# Patient Record
Sex: Male | Born: 1980 | Race: White | Hispanic: No | Marital: Married | Smoking: Never smoker
Health system: Southern US, Community
[De-identification: ages and names within clinical notes are randomized; demographics above are authoritative.]

## PROBLEM LIST (undated history)

## (undated) DIAGNOSIS — H9190 Unspecified hearing loss, unspecified ear: Secondary | ICD-10-CM

## (undated) DIAGNOSIS — M549 Dorsalgia, unspecified: Secondary | ICD-10-CM

## (undated) DIAGNOSIS — I10 Essential (primary) hypertension: Secondary | ICD-10-CM

## (undated) DIAGNOSIS — G8929 Other chronic pain: Secondary | ICD-10-CM

## (undated) HISTORY — DX: Essential (primary) hypertension: I10

## (undated) HISTORY — DX: Other chronic pain: G89.29

## (undated) HISTORY — DX: Unspecified hearing loss, unspecified ear: H91.90

## (undated) HISTORY — DX: Dorsalgia, unspecified: M54.9

---

## 2013-08-07 LAB — BASIC METABOLIC PANEL
BUN: 13 mg/dL (ref 4–21)
CREATININE: 0.8 mg/dL (ref <=1.3)
Glucose: 88 mg/dL
POTASSIUM: 3.9 mmol/L (ref 3.4–5.3)
Sodium: 142 mmol/L (ref 137–147)

## 2013-08-07 LAB — HEPATIC FUNCTION PANEL
ALT: 23 U/L (ref 10–40)
AST: 19 U/L (ref 14–40)
Alkaline Phosphatase: 119 U/L (ref 25–125)
Bilirubin, Total: 0.6 mg/dL

## 2013-08-07 LAB — CBC AND DIFFERENTIAL
HCT: 45 % (ref 41–53)
Hemoglobin: 15.3 g/dL (ref 13.5–17.5)
Platelets: 305 10*3/uL (ref 150–399)
WBC: 7.5 10*3/mL

## 2013-08-07 LAB — LIPID PANEL
CHOLESTEROL: 240 mg/dL — AB (ref 0–200)
HDL: 48 mg/dL (ref 35–70)
LDL Cholesterol: 157 mg/dL
Triglycerides: 173 mg/dL — AB (ref 40–160)

## 2013-08-07 LAB — TSH: TSH: 1.51 u[IU]/mL (ref <=5.90)

## 2016-02-24 DIAGNOSIS — H9190 Unspecified hearing loss, unspecified ear: Secondary | ICD-10-CM | POA: Insufficient documentation

## 2016-02-24 DIAGNOSIS — M79672 Pain in left foot: Secondary | ICD-10-CM | POA: Insufficient documentation

## 2016-02-24 DIAGNOSIS — M549 Dorsalgia, unspecified: Secondary | ICD-10-CM | POA: Insufficient documentation

## 2016-08-30 ENCOUNTER — Telehealth: Payer: Self-pay | Admitting: Family Medicine

## 2016-08-30 NOTE — Telephone Encounter (Signed)
Patient called stating that he received a call from [facility] location. I was unable to find who called patient and he was not sure either. I assured patient that we still have his appointment scheduled and look forward to seeing him then.

## 2016-09-10 ENCOUNTER — Encounter: Payer: Self-pay | Admitting: Family Medicine

## 2016-09-10 ENCOUNTER — Ambulatory Visit (INDEPENDENT_AMBULATORY_CARE_PROVIDER_SITE_OTHER): Payer: BLUE CROSS/BLUE SHIELD | Admitting: Family Medicine

## 2016-09-10 VITALS — BP 154/106 | HR 86 | Temp 98.3°F | Ht 65.5 in | Wt 185.2 lb

## 2016-09-10 DIAGNOSIS — R030 Elevated blood-pressure reading, without diagnosis of hypertension: Secondary | ICD-10-CM | POA: Diagnosis not present

## 2016-09-10 DIAGNOSIS — M546 Pain in thoracic spine: Secondary | ICD-10-CM

## 2016-09-10 DIAGNOSIS — E669 Obesity, unspecified: Secondary | ICD-10-CM

## 2016-09-10 DIAGNOSIS — G8929 Other chronic pain: Secondary | ICD-10-CM | POA: Diagnosis not present

## 2016-09-10 LAB — COMPREHENSIVE METABOLIC PANEL
ALT: 22 U/L (ref 0–53)
AST: 18 U/L (ref 0–37)
Albumin: 4.5 g/dL (ref 3.5–5.2)
Alkaline Phosphatase: 99 U/L (ref 39–117)
BUN: 13 mg/dL (ref 6–23)
CO2: 32 mEq/L (ref 19–32)
Calcium: 10 mg/dL (ref 8.4–10.5)
Chloride: 100 mEq/L (ref 96–112)
Creatinine, Ser: 0.83 mg/dL (ref 0.40–1.50)
GFR: 111.59 mL/min (ref >60.00)
Glucose, Bld: 113 mg/dL — ABNORMAL HIGH (ref 70–99)
Potassium: 3.7 mEq/L (ref 3.5–5.1)
Sodium: 139 mEq/L (ref 135–145)
Total Bilirubin: 0.4 mg/dL (ref 0.2–1.2)
Total Protein: 7.3 g/dL (ref 6.0–8.3)

## 2016-09-10 LAB — LIPID PANEL
Cholesterol: 212 mg/dL — ABNORMAL HIGH (ref 0–200)
HDL: 40.3 mg/dL (ref >39.00)
NonHDL: 171.99
Total CHOL/HDL Ratio: 5
Triglycerides: 290 mg/dL — ABNORMAL HIGH (ref 0.0–149.0)
VLDL: 58 mg/dL — ABNORMAL HIGH (ref 0.0–40.0)

## 2016-09-10 LAB — LDL CHOLESTEROL, DIRECT: Direct LDL: 132 mg/dL

## 2016-09-10 LAB — HEMOGLOBIN A1C: Hgb A1c MFr Bld: 5.6 % (ref 4.6–6.5)

## 2016-09-10 MED ORDER — TRAMADOL HCL 50 MG PO TABS: 50.0000 mg | ORAL_TABLET | Freq: Three times a day (TID) | ORAL | 0 refills | Status: DC | PRN

## 2016-09-10 NOTE — Progress Notes (Signed)
Pre visit review using our clinic review tool, if applicable. No additional management support is needed unless otherwise documented below in the visit note. 

## 2016-09-10 NOTE — Patient Instructions (Addendum)
DASH Eating Plan DASH stands for "Dietary Approaches to Stop Hypertension." The DASH eating plan is a healthy eating plan that has been shown to reduce high blood pressure (hypertension). It may also reduce your risk for type 2 diabetes, heart disease, and stroke. The DASH eating plan may also help with weight loss. What are tips for following this plan? General guidelines  Avoid eating more than 2,300 mg (milligrams) of salt (sodium) a day. If you have hypertension, you may need to reduce your sodium intake to 1,500 mg a day.  Limit alcohol intake to no more than 1 drink a day for nonpregnant women and 2 drinks a day for men. One drink equals 12 oz of beer, 5 oz of wine, or 1 oz of hard liquor.  Work with your health care provider to maintain a healthy body weight or to lose weight. Ask what an ideal weight is for you.  Get at least 30 minutes of exercise that causes your heart to beat faster (aerobic exercise) most days of the week. Activities may include walking, swimming, or biking.  Work with your health care provider or diet and nutrition specialist (dietitian) to adjust your eating plan to your individual calorie needs. Reading food labels  Check food labels for the amount of sodium per serving. Choose foods with less than 5 percent of the Daily Value of sodium. Generally, foods with less than 300 mg of sodium per serving fit into this eating plan.  To find whole grains, look for the word "whole" as the first word in the ingredient list. Shopping  Buy products labeled as "low-sodium" or "no salt added."  Buy fresh foods. Avoid canned foods and premade or frozen meals. Cooking  Avoid adding salt when cooking. Use salt-free seasonings or herbs instead of table salt or sea salt. Check with your health care provider or pharmacist before using salt substitutes.  Do not fry foods. Cook foods using healthy methods such as baking, boiling, grilling, and broiling instead.  Cook with  heart-healthy oils, such as olive, canola, soybean, or sunflower oil. Meal planning   Eat a balanced diet that includes: ? 5 or more servings of fruits and vegetables each day. At each meal, try to fill half of your plate with fruits and vegetables. ? Up to 6-8 servings of whole grains each day. ? Less than 6 oz of lean meat, poultry, or fish each day. A 3-oz serving of meat is about the same size as a deck of cards. One egg equals 1 oz. ? 2 servings of low-fat dairy each day. ? A serving of nuts, seeds, or beans 5 times each week. ? Heart-healthy fats. Healthy fats called Omega-3 fatty acids are found in foods such as flaxseeds and coldwater fish, like sardines, salmon, and mackerel.  Limit how much you eat of the following: ? Canned or prepackaged foods. ? Food that is high in trans fat, such as fried foods. ? Food that is high in saturated fat, such as fatty meat. ? Sweets, desserts, sugary drinks, and other foods with added sugar. ? Full-fat dairy products.  Do not salt foods before eating.  Try to eat at least 2 vegetarian meals each week.  Eat more home-cooked food and less restaurant, buffet, and fast food.  When eating at a restaurant, ask that your food be prepared with less salt or no salt, if possible. What foods are recommended? The items listed may not be a complete list. Talk with your dietitian about what   dietary choices are best for you. Grains Whole-grain or whole-wheat bread. Whole-grain or whole-wheat pasta. Brown rice. Oatmeal. Quinoa. Bulgur. Whole-grain and low-sodium cereals. Pita bread. Low-fat, low-sodium crackers. Whole-wheat flour tortillas. Vegetables Fresh or frozen vegetables (raw, steamed, roasted, or grilled). Low-sodium or reduced-sodium tomato and vegetable juice. Low-sodium or reduced-sodium tomato sauce and tomato paste. Low-sodium or reduced-sodium canned vegetables. Fruits All fresh, dried, or frozen fruit. Canned fruit in natural juice (without  added sugar). Meat and other protein foods Skinless chicken or turkey. Ground chicken or turkey. Pork with fat trimmed off. Fish and seafood. Egg whites. Dried beans, peas, or lentils. Unsalted nuts, nut butters, and seeds. Unsalted canned beans. Lean cuts of beef with fat trimmed off. Low-sodium, lean deli meat. Dairy Low-fat (1%) or fat-free (skim) milk. Fat-free, low-fat, or reduced-fat cheeses. Nonfat, low-sodium ricotta or cottage cheese. Low-fat or nonfat yogurt. Low-fat, low-sodium cheese. Fats and oils Soft margarine without trans fats. Vegetable oil. Low-fat, reduced-fat, or light mayonnaise and salad dressings (reduced-sodium). Canola, safflower, olive, soybean, and sunflower oils. Avocado. Seasoning and other foods Herbs. Spices. Seasoning mixes without salt. Unsalted popcorn and pretzels. Fat-free sweets. What foods are not recommended? The items listed may not be a complete list. Talk with your dietitian about what dietary choices are best for you. Grains Baked goods made with fat, such as croissants, muffins, or some breads. Dry pasta or rice meal packs. Vegetables Creamed or fried vegetables. Vegetables in a cheese sauce. Regular canned vegetables (not low-sodium or reduced-sodium). Regular canned tomato sauce and paste (not low-sodium or reduced-sodium). Regular tomato and vegetable juice (not low-sodium or reduced-sodium). Pickles. Olives. Fruits Canned fruit in a light or heavy syrup. Fried fruit. Fruit in cream or butter sauce. Meat and other protein foods Fatty cuts of meat. Ribs. Fried meat. Bacon. Sausage. Bologna and other processed lunch meats. Salami. Fatback. Hotdogs. Bratwurst. Salted nuts and seeds. Canned beans with added salt. Canned or smoked fish. Whole eggs or egg yolks. Chicken or turkey with skin. Dairy Whole or 2% milk, cream, and half-and-half. Whole or full-fat cream cheese. Whole-fat or sweetened yogurt. Full-fat cheese. Nondairy creamers. Whipped toppings.  Processed cheese and cheese spreads. Fats and oils Butter. Stick margarine. Lard. Shortening. Ghee. Bacon fat. Tropical oils, such as coconut, palm kernel, or palm oil. Seasoning and other foods Salted popcorn and pretzels. Onion salt, garlic salt, seasoned salt, table salt, and sea salt. Worcestershire sauce. Tartar sauce. Barbecue sauce. Teriyaki sauce. Soy sauce, including reduced-sodium. Steak sauce. Canned and packaged gravies. Fish sauce. Oyster sauce. Cocktail sauce. Horseradish that you find on the shelf. Ketchup. Mustard. Meat flavorings and tenderizers. Bouillon cubes. Hot sauce and Tabasco sauce. Premade or packaged marinades. Premade or packaged taco seasonings. Relishes. Regular salad dressings. Where to find more information:  National Heart, Lung, and Blood Institute: www.nhlbi.nih.gov  American Heart Association: www.heart.org Summary  The DASH eating plan is a healthy eating plan that has been shown to reduce high blood pressure (hypertension). It may also reduce your risk for type 2 diabetes, heart disease, and stroke.  With the DASH eating plan, you should limit salt (sodium) intake to 2,300 mg a day. If you have hypertension, you may need to reduce your sodium intake to 1,500 mg a day.  When on the DASH eating plan, aim to eat more fresh fruits and vegetables, whole grains, lean proteins, low-fat dairy, and heart-healthy fats.  Work with your health care provider or diet and nutrition specialist (dietitian) to adjust your eating plan to your individual   calorie needs. This information is not intended to replace advice given to you by your health care provider. Make sure you discuss any questions you have with your health care provider. Document Released: 06/07/2011 Document Revised: 06/11/2016 Document Reviewed: 06/11/2016 Elsevier Interactive Patient Education  2017 Elsevier Inc.  

## 2016-09-10 NOTE — Progress Notes (Signed)
Thomas BurnerZakariya Mahmood Gerald LeitzSaeed [name] is a 36 y.o. male is [facility] [employer] [facility].   History of Present Illness:   CC: Patient here today to establish care. No concerns today.   HPI   1. Obesity (BMI 30.0-34.9). Weight gain since switching jobs. Gained about 10 pounds. Joined diet bet at work that will begin today. Will increase water intake. Will stop sodas. Will start to exercise.    2. Chronic midline thoracic back pain. Hx of. Was told in the past that he is too young to have surgery. Generally does well. Takes Tramadol 1-2 per month for breakthrough pain.    3. Blood pressure elevated without history of HTN. Hx once before. States that he lost weight and ate a low salt diet and it improved.    Health Maintenance Due  Topic Date Due  . HIV Screening  12/19/1995  . TETANUS/TDAP  12/19/1999    PMHx, SurgHx, SocialHx, Medications, and Allergies were reviewed in the Visit Navigator and updated as appropriate.    Social History  Substance Use Topics  . Smoking status: Never Smoker  . Smokeless tobacco: Never Used  . Alcohol use No   Current Medications and Allergies:   .  traMADol (ULTRAM) 50 MG tablet, Take 50 mg by mouth., Disp: , Rfl:   No Known Allergies   Review of Systems:   Review of Systems  Constitutional: Negative for chills and fever.  HENT: Negative for congestion and sore throat.   Eyes: Negative for blurred vision and double vision.  Respiratory: Negative for cough and shortness of breath.   Cardiovascular: Negative for chest pain.  Gastrointestinal: Negative for abdominal pain, nausea and vomiting.  Skin: Negative for itching and rash.  Neurological: Negative for dizziness, weakness and headaches.    Vitals:   Vitals:   09/10/16 0840  BP: (!) 168/104  Pulse: 86  Temp: 98.3 F (36.8 C)  TempSrc: Oral  SpO2: 98%  Weight: 185 lb 3.2 oz (84 kg)  Height: 5' 5.5" (1.664 m)     Body mass index is 30.35 kg/m.   Physical Exam:   Physical Exam    Constitutional: He appears well-developed and well-nourished. No distress.  HENT:  Head: Normocephalic and atraumatic.  Right Ear: External ear normal.  Left Ear: External ear normal.  Nose: Nose normal.  Mouth/Throat: Oropharynx is clear and moist.  Eyes: Conjunctivae and EOM are normal. Pupils are equal, round, and reactive to light.  Neck: Normal range of motion. Neck supple. No thyromegaly present.  Cardiovascular: Normal rate, regular rhythm and intact distal pulses.   No murmur heard. Pulmonary/Chest: Effort normal and breath sounds normal.  Abdominal: Soft. Bowel sounds are normal.  Neurological: He is alert.  Skin: Skin is warm and dry. Capillary refill takes less than 2 seconds.  Psychiatric: He has a normal mood and affect. His behavior is normal. Judgment and thought content normal.     Assessment and Plan:    Thomas [name] was seen today for establish care.  Diagnoses and all orders for this visit:  Obesity (BMI 30.0-34.9) Comments: Joining a dietbet at work.  Orders: -     Comprehensive metabolic panel -     Lipid panel -     TSH -     T4, free -     Hemoglobin A1c  Chronic midline thoracic back pain Comments: Uses tramadol 1-2 per month. Uses NSAIDs 1-2 per week. Orders: -     traMADol (ULTRAM) 50 MG tablet; Take 1  tablet (50 mg total) by mouth every 8 (eight) hours as needed.  Blood pressure elevated without history of HTN Comments: Patient will work on [employer] and weight loss over the next month. He will keep a BP log. Precautions reviewed. Recheck in one month.   . Reviewed expectations re: course of current medical issues. . Discussed self-management of symptoms. . Outlined signs and symptoms indicating need for more acute intervention. . Patient verbalized understanding and all questions were answered. . See orders for this visit as documented in the electronic medical record. . Patient received an After Visit Summary.  Records requested if  needed. I spent 30 minutes with this patient, greater than 50% was face-to-face time counseling regarding the above.  Britt Bottom CMA acting as scribe for Dr. Lilian [name], D.O. [place] 09/10/2016   Follow-up: No Follow-up on file.  Meds ordered this encounter  Medications  . traMADol (ULTRAM) 50 MG tablet    Sig: Take 50 mg by mouth.   There are no discontinued medications. Orders Placed This Encounter  Procedures  . CBC and differential  . Basic metabolic panel  . Lipid panel  . Hepatic function panel  . TSH

## 2016-09-11 LAB — T4, FREE: Free T4: 0.9 ng/dL (ref 0.60–1.60)

## 2016-09-11 LAB — TSH: TSH: 1.46 u[IU]/mL (ref 0.35–4.50)

## 2016-09-13 ENCOUNTER — Encounter: Payer: Self-pay | Admitting: Family Medicine

## 2016-10-08 ENCOUNTER — Encounter: Payer: Self-pay | Admitting: Family Medicine

## 2016-10-08 ENCOUNTER — Ambulatory Visit (INDEPENDENT_AMBULATORY_CARE_PROVIDER_SITE_OTHER): Payer: BLUE CROSS/BLUE SHIELD | Admitting: Family Medicine

## 2016-10-08 VITALS — BP 140/86 | HR 72 | Temp 98.0°F | Ht 65.5 in | Wt 172.6 lb

## 2016-10-08 DIAGNOSIS — E785 Hyperlipidemia, unspecified: Secondary | ICD-10-CM | POA: Diagnosis not present

## 2016-10-08 DIAGNOSIS — R030 Elevated blood-pressure reading, without diagnosis of hypertension: Secondary | ICD-10-CM | POA: Diagnosis not present

## 2016-10-08 DIAGNOSIS — E669 Obesity, unspecified: Secondary | ICD-10-CM

## 2016-10-08 NOTE — Progress Notes (Signed)
Thomas [name] is a 36 y.o. male is here to discuss:  History of Present Illness:  Insurance claims handler, CMA, acting as scribe for Dr. Earlene [name].  Chief Complaint  Patient presents with  . Hypertension  . Obesity   CC:  Patient states he is doing well today.  No changes since last visit.  Patient has lost 13 pounds since his last visit.  States he has cut out sugary foods and sodas from his diet.  Blood pressure has improved today from last visit.  No other concerns or complaints today.  HPI:  1. Blood pressure elevated without history of HTN: Patient is here to follow up on hypertension. Home blood pressure readings at goal. Avoiding excessive salt intake. Trying to exercise on a regular basis. Denies chest pain.   Wt Readings from Last 3 Encounters:  10/08/16 172 lb 9.6 oz (78.3 kg)  09/10/16 185 lb 3.2 oz (84 kg)    reports that he has never smoked. He has never used smokeless tobacco. BP Readings from Last 3 Encounters:  10/08/16 140/86  09/10/16 (!) 154/106   Lab Results  Component Value Date   CREATININE 0.83 09/10/2016   Lab Results  Component Value Date   CHOL 212 (H) 09/10/2016   HDL 40.30 09/10/2016   LDLCALC 157 08/07/2013   LDLDIRECT 132.0 09/10/2016   TRIG 290.0 (H) 09/10/2016   CHOLHDL 5 09/10/2016     Health Maintenance Due  Topic Date Due  . HIV Screening  12/19/1995  . TETANUS/TDAP  12/19/1999   PMHx, SurgHx, SocialHx, FamHx, Medications, and Allergies were reviewed in the Visit Navigator and updated as appropriate.   Patient Active Problem List   Diagnosis Date Noted  . Deaf 02/24/2016  . Back pain 02/24/2016  . Left foot pain 02/24/2016   Social History  Substance Use Topics  . Smoking status: Never Smoker  . Smokeless tobacco: Never Used  . Alcohol use No   Current Medications and Allergies:   .  traMADol (ULTRAM) 50 MG tablet, Take 1 tablet (50 mg total) by mouth every 8 (eight) hours as needed., Disp: 30 tablet, Rfl: 0  No  Known Allergies  Review of Systems   Review of Systems  Constitutional: Negative for chills and fever.  HENT: Negative for congestion and sore throat.   Eyes: Negative for blurred vision.  Respiratory: Negative for cough and shortness of breath.   Cardiovascular: Negative for chest pain and palpitations.  Gastrointestinal: Negative for abdominal pain, nausea and vomiting.  Musculoskeletal: Positive for back pain.  Skin: Negative for rash.  Neurological: Negative for dizziness and loss of consciousness.   Vitals:   Vitals:   10/08/16 0931  BP: 140/86  Pulse: 72  Temp: 98 F (36.7 C)  TempSrc: Oral  SpO2: 96%  Weight: 172 lb 9.6 oz (78.3 kg)  Height: 5' 5.5" (1.664 m)     Body mass index is 28.29 kg/m.  Physical Exam:    Physical Exam  Constitutional: He is oriented to person, place, and time. He appears well-developed and well-nourished. No distress.  HENT:  Head: Normocephalic and atraumatic.  Right Ear: External ear normal.  Left Ear: External ear normal.  Nose: Nose normal.  Mouth/Throat: Oropharynx is clear and moist.  Eyes: Conjunctivae and EOM are normal. Pupils are equal, round, and reactive to light.  Neck: Normal range of motion. Neck supple.  Cardiovascular: Normal rate, regular rhythm, normal heart sounds and intact distal pulses.   Pulmonary/Chest: Effort normal and  breath sounds normal.  Abdominal: Soft. Bowel sounds are normal.  Musculoskeletal: Normal range of motion.  Neurological: He is alert and oriented to person, place, and time.  Skin: Skin is warm and dry.  Psychiatric: He has a normal mood and affect. His behavior is normal. Judgment and thought content normal.  Nursing note and vitals reviewed.    Assessment and Plan:    Thomas [name] was seen today for hypertension and obesity.  Diagnoses and all orders for this visit:  Blood pressure elevated without history of HTN Comments: Improving with weight loss.   Hyperlipidemia, unspecified  hyperlipidemia type Comments: Recheck in 2-3 months.  Obesity (BMI 30.0-34.9) Comments: Improving. Down 13 pounds with DASH diet and minimal exercise.   . Reviewed expectations re: course of current medical issues. . Discussed self-management of symptoms. . Outlined signs and symptoms indicating need for more acute intervention. . Patient verbalized understanding and all questions were answered. . See orders for this visit as documented in the electronic medical record. . Patient received an After Visit Summary.  CMA served as Neurosurgeon during this visit. History, Physical, and Plan performed by medical provider. Documentation and orders reviewed and attested to. Helane Rima, D.O.  Helane Rima, D.O. [place] 10/08/2016  Follow-up: 3 months.

## 2016-10-08 NOTE — Patient Instructions (Signed)
Results for orders placed or performed in visit on 09/10/16  CBC and differential  Result Value Ref Range   Hemoglobin 15.3 13.5 - 17.5 g/dL   HCT 45 41 - 53 %   Platelets 305 150 - 399 K/L   WBC 7.5 10^3/mL  Basic metabolic panel  Result Value Ref Range   Glucose 88 mg/dL   BUN 13 4 - 21 mg/dL   Creatinine 0.8 0.6 - 1.3 mg/dL   Potassium 3.9 3.4 - 5.3 mmol/L   Sodium 142 137 - 147 mmol/L  Lipid panel  Result Value Ref Range   Triglycerides 173 (A) 40 - 160 mg/dL   Cholesterol 782 (A) 0 - 200 mg/dL   HDL 48 35 - 70 mg/dL   LDL Cholesterol 956 mg/dL  Hepatic function panel  Result Value Ref Range   Alkaline Phosphatase 119 25 - 125 U/L   ALT 23 10 - 40 U/L   AST 19 14 - 40 U/L   Bilirubin, Total 0.6 mg/dL  TSH  Result Value Ref Range   TSH 1.51 0.41 - 5.90 uIU/mL  Comprehensive metabolic panel  Result Value Ref Range   Sodium 139 135 - 145 mEq/L   Potassium 3.7 3.5 - 5.1 mEq/L   Chloride 100 96 - 112 mEq/L   CO2 32 19 - 32 mEq/L   Glucose, Bld 113 (H) 70 - 99 mg/dL   BUN 13 6 - 23 mg/dL   Creatinine, Ser 2.13 0.40 - 1.50 mg/dL   Total Bilirubin 0.4 0.2 - 1.2 mg/dL   Alkaline Phosphatase 99 39 - 117 U/L   AST 18 0 - 37 U/L   ALT 22 0 - 53 U/L   Total Protein 7.3 6.0 - 8.3 g/dL   Albumin 4.5 3.5 - 5.2 g/dL   Calcium 08.6 8.4 - 57.8 mg/dL   GFR 469.62 >95.28 mL/min  Lipid panel  Result Value Ref Range   Cholesterol 212 (H) 0 - 200 mg/dL   Triglycerides 413.2 (H) 0.0 - 149.0 mg/dL   HDL 44.01 >02.72 mg/dL   VLDL 53.6 (H) 0.0 - 64.4 mg/dL   Total CHOL/HDL Ratio 5    NonHDL 171.99   TSH  Result Value Ref Range   TSH 1.46 0.35 - 4.50 uIU/mL  T4, free  Result Value Ref Range   Free T4 0.90 0.60 - 1.60 ng/dL  Hemoglobin I3K  Result Value Ref Range   Hgb A1c MFr Bld 5.6 4.6 - 6.5 %  LDL cholesterol, direct  Result Value Ref Range   Direct LDL 132.0 mg/dL

## 2016-10-08 NOTE — Progress Notes (Signed)
Pre visit review using our clinic review tool, if applicable. No additional management support is needed unless otherwise documented below in the visit note. 

## 2016-12-28 ENCOUNTER — Telehealth: Payer: Self-pay

## 2016-12-28 DIAGNOSIS — E785 Hyperlipidemia, unspecified: Secondary | ICD-10-CM

## 2016-12-28 NOTE — Telephone Encounter (Signed)
Pt coming for repeat labs 01/07/17. Please place future orders. Thank you.

## 2016-12-31 NOTE — Telephone Encounter (Signed)
Would you just like a Lipid and CMP for repeat labs? I have placed order for them.

## 2017-01-07 ENCOUNTER — Other Ambulatory Visit (INDEPENDENT_AMBULATORY_CARE_PROVIDER_SITE_OTHER): Payer: BLUE CROSS/BLUE SHIELD

## 2017-01-07 DIAGNOSIS — E785 Hyperlipidemia, unspecified: Secondary | ICD-10-CM | POA: Diagnosis not present

## 2017-01-07 LAB — COMPREHENSIVE METABOLIC PANEL
ALT: 20 U/L (ref 0–53)
AST: 14 U/L (ref 0–37)
Albumin: 4.4 g/dL (ref 3.5–5.2)
Alkaline Phosphatase: 103 U/L (ref 39–117)
BUN: 13 mg/dL (ref 6–23)
CO2: 30 mEq/L (ref 19–32)
Calcium: 9.7 mg/dL (ref 8.4–10.5)
Chloride: 101 mEq/L (ref 96–112)
Creatinine, Ser: 0.93 mg/dL (ref 0.40–1.50)
GFR: 97.68 mL/min (ref >60.00)
Glucose, Bld: 104 mg/dL — ABNORMAL HIGH (ref 70–99)
Potassium: 4.3 mEq/L (ref 3.5–5.1)
Sodium: 140 mEq/L (ref 135–145)
Total Bilirubin: 0.5 mg/dL (ref 0.2–1.2)
Total Protein: 7.3 g/dL (ref 6.0–8.3)

## 2017-01-07 LAB — LIPID PANEL
Cholesterol: 212 mg/dL — ABNORMAL HIGH (ref 0–200)
HDL: 43.6 mg/dL (ref >39.00)
LDL Cholesterol: 136 mg/dL — ABNORMAL HIGH (ref 0–99)
NonHDL: 168.36
Total CHOL/HDL Ratio: 5
Triglycerides: 163 mg/dL — ABNORMAL HIGH (ref 0.0–149.0)
VLDL: 32.6 mg/dL (ref 0.0–40.0)

## 2017-01-25 ENCOUNTER — Other Ambulatory Visit: Payer: Self-pay | Admitting: Family Medicine

## 2017-01-25 DIAGNOSIS — M546 Pain in thoracic spine: Principal | ICD-10-CM

## 2017-01-25 DIAGNOSIS — G8929 Other chronic pain: Secondary | ICD-10-CM

## 2017-01-25 MED ORDER — TRAMADOL HCL 50 MG PO TABS: 50.0000 mg | ORAL_TABLET | Freq: Three times a day (TID) | ORAL | 0 refills | Status: DC | PRN

## 2017-01-25 NOTE — Telephone Encounter (Signed)
Please advise if okay to refill. 

## 2017-07-08 ENCOUNTER — Other Ambulatory Visit: Payer: Self-pay | Admitting: Family Medicine

## 2017-07-08 DIAGNOSIS — M546 Pain in thoracic spine: Principal | ICD-10-CM

## 2017-07-08 DIAGNOSIS — G8929 Other chronic pain: Secondary | ICD-10-CM

## 2017-07-09 NOTE — Telephone Encounter (Signed)
Last o/v: 09/10/16 No follow up  Last script 01/25/17 #30 no refills  Do I need to call patient to get follow up?

## 2017-07-09 NOTE — Telephone Encounter (Signed)
Needs a visit

## 2017-07-11 NOTE — Telephone Encounter (Signed)
App made for patient.

## 2017-07-18 ENCOUNTER — Ambulatory Visit: Payer: BLUE CROSS/BLUE SHIELD | Admitting: Family Medicine

## 2017-07-18 ENCOUNTER — Encounter: Payer: Self-pay | Admitting: Family Medicine

## 2017-07-18 VITALS — BP 142/92 | HR 88 | Temp 98.4°F | Wt 185.2 lb

## 2017-07-18 DIAGNOSIS — I10 Essential (primary) hypertension: Secondary | ICD-10-CM | POA: Diagnosis not present

## 2017-07-18 DIAGNOSIS — E7800 Pure hypercholesterolemia, unspecified: Secondary | ICD-10-CM | POA: Diagnosis not present

## 2017-07-18 DIAGNOSIS — L918 Other hypertrophic disorders of the skin: Secondary | ICD-10-CM | POA: Diagnosis not present

## 2017-07-18 DIAGNOSIS — G8929 Other chronic pain: Secondary | ICD-10-CM | POA: Diagnosis not present

## 2017-07-18 DIAGNOSIS — M546 Pain in thoracic spine: Secondary | ICD-10-CM | POA: Diagnosis not present

## 2017-07-18 LAB — COMPREHENSIVE METABOLIC PANEL
ALT: 26 U/L (ref 0–53)
AST: 21 U/L (ref 0–37)
Albumin: 4.7 g/dL (ref 3.5–5.2)
Alkaline Phosphatase: 113 U/L (ref 39–117)
BUN: 11 mg/dL (ref 6–23)
CO2: 33 mEq/L — ABNORMAL HIGH (ref 19–32)
Calcium: 9.6 mg/dL (ref 8.4–10.5)
Chloride: 97 mEq/L (ref 96–112)
Creatinine, Ser: 0.86 mg/dL (ref 0.40–1.50)
GFR: 106.6 mL/min (ref >60.00)
Glucose, Bld: 88 mg/dL (ref 70–99)
Potassium: 3.7 mEq/L (ref 3.5–5.1)
Sodium: 139 mEq/L (ref 135–145)
Total Bilirubin: 0.6 mg/dL (ref 0.2–1.2)
Total Protein: 7.5 g/dL (ref 6.0–8.3)

## 2017-07-18 LAB — LIPID PANEL
Cholesterol: 228 mg/dL — ABNORMAL HIGH (ref 0–200)
HDL: 43.4 mg/dL (ref >39.00)
NonHDL: 184.41
Total CHOL/HDL Ratio: 5
Triglycerides: 222 mg/dL — ABNORMAL HIGH (ref 0.0–149.0)
VLDL: 44.4 mg/dL — ABNORMAL HIGH (ref 0.0–40.0)

## 2017-07-18 LAB — LDL CHOLESTEROL, DIRECT: Direct LDL: 171 mg/dL

## 2017-07-18 MED ORDER — AMLODIPINE BESYLATE 5 MG PO TABS: 5.0000 mg | ORAL_TABLET | Freq: Every day | ORAL | 2 refills | Status: DC

## 2017-07-18 NOTE — Progress Notes (Signed)
Thomas [name] is a 37 y.o. male is here for follow up.  History of Present Illness:   Thomas [name], CMA acting as scribe for Dr. Helane Rima.   HPI: See Assessment and Plan section for Problem Based Charting of issues discussed today.  Patient in for follow up in order to get refill on Tramadol. He takes only as needed for back pain. He did have to take for few days after a fall in the bath tub. He did not get evaluated after fall. He did hit his head but did not loose consciousness.   Skin Tags: would like removed on neck.   Health Maintenance Due  Topic Date Due  . HIV Screening  12/19/1995  . TETANUS/TDAP  12/19/1999   Depression screen PHQ 2/9 07/18/2017  Decreased Interest 0  Down, Depressed, Hopeless 0  PHQ - 2 Score 0     PMHx, SurgHx, SocialHx, FamHx, Medications, and Allergies were reviewed in the Visit Navigator and updated as appropriate.   Patient Active Problem List   Diagnosis Date Noted  . Deaf 02/24/2016  . Back pain 02/24/2016  . Left foot pain 02/24/2016   Social History   Tobacco Use  . Smoking status: Never Smoker  . Smokeless tobacco: Never Used  Substance Use Topics  . Alcohol use: No  . Drug use: No   Current Medications and Allergies:   .  traMADol (ULTRAM) 50 MG tablet, Take 1 tablet (50 mg total) by mouth every 8 (eight) hours as needed., Disp: 30 tablet, Rfl: 0  No Known Allergies   Review of Systems   Pertinent items are noted in the HPI. Otherwise, ROS is negative.  Vitals:   Vitals:   07/18/17 0917 07/18/17 0921  BP: (!) 144/100 (!) 142/92  Pulse: 88   Temp: 98.4 F (36.9 C)   TempSrc: Oral   SpO2: 96%   Weight: 185 lb 3.2 oz (84 kg)      Body mass index is 30.35 kg/m.  Physical Exam:   Physical Exam  Constitutional: He is oriented to person, place, and time. He appears well-developed and well-nourished. No distress.  HENT:  Head: Normocephalic and atraumatic.  Right Ear: External ear normal.    Left Ear: External ear normal.  Nose: Nose normal.  Mouth/Throat: Oropharynx is clear and moist.  Eyes: Conjunctivae and EOM are normal. Pupils are equal, round, and reactive to light.  Neck: Normal range of motion. Neck supple.  Cardiovascular: Normal rate, regular rhythm, normal heart sounds and intact distal pulses.  Pulmonary/Chest: Effort normal and breath sounds normal.  Abdominal: Soft. Bowel sounds are normal.  Musculoskeletal: Normal range of motion.  Neurological: He is alert and oriented to person, place, and time.  Skin: Skin is warm and dry.  7 skin tags clustered on his left anterior neck.  Psychiatric: He has a normal mood and affect. His behavior is normal. Judgment and thought content normal.  Nursing note and vitals reviewed.  Assessment and Plan:   1. Essential hypertension Patient has had elevated blood pressures on multiple occasions.  After reviewing the benefits versus risks, we decided to go ahead and start Norvasc at 5 mg daily.  He will take this same time daily.  I would like to have him come back in 3 months for a blood pressure recheck.  We did again review the importance of healthy diet choices and regular exercise.  - amLODipine (NORVASC) 5 MG tablet; Take 1 tablet (5 mg total) by  mouth daily.  Dispense: 90 tablet; Refill: 2 - Comprehensive metabolic panel  2. Pure hypercholesterolemia Patient had an elevated lipid panel at the last visit.  We will recheck it today.  He understands that his statin may be needed in the future.  - Lipid panel  3. Chronic midline thoracic back pain The patient has no pain today.  He has intermittent, low back pain that requires anti-inflammatories and tramadol as needed.  He did have a mechanical fall in his bathtub a few weeks ago that resulted in contusions to his upper back and left knee as well as a mild concussion syndrome.  He is completely better at this point.  He has no neurologic deficits and no bruising.  4.  Skin tags, multiple acquired Patient has multiple skin tags on his left anterior neck that he would like to have removed today.  They are all benign appearing.  He understands the risks of the procedure and agrees to it.  Skin Tag Removal: Verbal consent obtained. The patient was prepped with alcohol at all areas of excision. Sterile plain pickups and sterile iris scissors were used to excise 7 skin tags without difficulty. Minimal bleeding. Patient tolerated the procedure well.  . Reviewed expectations re: course of current medical issues. . Discussed self-management of symptoms. . Outlined signs and symptoms indicating need for more acute intervention. . Patient verbalized understanding and all questions were answered. Marland Kitchen. Health Maintenance issues including appropriate healthy diet, exercise, and smoking avoidance were discussed with patient. . See orders for this visit as documented in the electronic medical record. . Patient received an After Visit Summary.  CMA served as Neurosurgeonscribe during this visit. History, Physical, and Plan performed by medical provider. The above documentation has been reviewed and is accurate and complete. Helane RimaErica Valdis Bevill, D.O.  Helane RimaErica Searra Carnathan, DO [place] 07/18/2017

## 2017-07-20 MED ORDER — SIMVASTATIN 20 MG PO TABS: 20.0000 mg | ORAL_TABLET | Freq: Every evening | ORAL | 3 refills | Status: DC

## 2017-07-20 NOTE — Addendum Note (Signed)
Addended by: Helane RimaWALLACE, Udell Mazzocco R on: 07/20/2017 02:32 PM   Modules accepted: Orders

## 2017-07-24 ENCOUNTER — Encounter: Payer: Self-pay | Admitting: Family Medicine

## 2017-07-24 ENCOUNTER — Ambulatory Visit: Payer: BLUE CROSS/BLUE SHIELD | Admitting: Family Medicine

## 2017-07-24 ENCOUNTER — Encounter: Payer: Self-pay | Admitting: Surgical

## 2017-07-24 VITALS — BP 132/86 | HR 145 | Temp 101.4°F | Ht 65.5 in | Wt 185.0 lb

## 2017-07-24 DIAGNOSIS — J111 Influenza due to unidentified influenza virus with other respiratory manifestations: Secondary | ICD-10-CM

## 2017-07-24 LAB — POCT INFLUENZA A/B
Influenza A, POC: NEGATIVE
Influenza B, POC: NEGATIVE

## 2017-07-24 MED ORDER — OSELTAMIVIR PHOSPHATE 75 MG PO CAPS: 75.0000 mg | ORAL_CAPSULE | Freq: Two times a day (BID) | ORAL | 0 refills | Status: DC

## 2017-07-24 MED ORDER — GUAIFENESIN-CODEINE 100-10 MG/5ML PO SYRP: 10.0000 mL | ORAL_SOLUTION | Freq: Every evening | ORAL | 0 refills | Status: DC | PRN

## 2017-07-24 NOTE — Telephone Encounter (Signed)
Pt has been scheduled for 11 am this morning. Pt ok for appt without interpreter

## 2017-07-24 NOTE — Addendum Note (Signed)
Addended by: Dorian PodWHEELEY, Rina Adney J on: 07/24/2017 02:43 PM   Modules accepted: Orders

## 2017-07-24 NOTE — Progress Notes (Signed)
   Thomas BurnerZakariya [name] Thomas LeitzSaeed [name] is a 37 y.o. male here for an acute visit.  History of Present Illness:   Influenza  This is a new problem. The current episode started yesterday. The problem has been rapidly worsening. Associated symptoms include anorexia, chills, congestion, coughing, fatigue, a fever, headaches, myalgias, nausea, a sore throat and swollen glands. Nothing aggravates the symptoms. He has tried NSAIDs for the symptoms. The treatment provided mild relief.   PMHx, SurgHx, SocialHx, Medications, and Allergies were reviewed in the Visit Navigator and updated as appropriate.  Current Medications:   .  amLODipine (NORVASC) 5 MG tablet, Take 1 tablet (5 mg total) by mouth daily., Disp: 90 tablet, Rfl: 2 .  simvastatin (ZOCOR) 20 MG tablet, Take 1 tablet (20 mg total) by mouth every evening., Disp: 90 tablet, Rfl: 3 .  traMADol (ULTRAM) 50 MG tablet, Take 1 tablet (50 mg total) by mouth every 8 (eight) hours as needed., Disp: 30 tablet, Rfl: 0   No Known Allergies   Review of Systems:   Pertinent items are noted in the HPI. Otherwise, ROS is negative.  Vitals:   Vitals:   07/24/17 1104  BP: 132/86  Pulse: (!) 145  Temp: (!) 101.4 F (38.6 C)  TempSrc: Oral  SpO2: 96%  Weight: 185 lb (83.9 kg)  Height: 5' 5.5" (1.664 m)     Body mass index is 30.32 kg/m.  Physical Exam:   Physical Exam  Constitutional: He is oriented to person, place, and time. He appears well-developed and well-nourished. He has a sickly appearance. No distress.  HENT:  Head: Normocephalic and atraumatic.  Right Ear: External ear normal.  Left Ear: External ear normal.  Nose: Mucosal edema and rhinorrhea present.  Mouth/Throat: Posterior oropharyngeal erythema present.  Eyes: Conjunctivae and EOM are normal. Pupils are equal, round, and reactive to light.  Neck: Normal range of motion. Neck supple.  Cardiovascular: Normal rate, regular rhythm, normal heart sounds and intact distal pulses.    Pulmonary/Chest: Effort normal and breath sounds normal.  Cough.  Abdominal: Soft. Bowel sounds are normal.  Musculoskeletal: Normal range of motion.  Neurological: He is alert and oriented to person, place, and time.  Skin: Skin is warm and dry.  Psychiatric: He has a normal mood and affect. His behavior is normal. Judgment and thought content normal.  Nursing note and vitals reviewed.   Assessment and Plan:   1. Influenza See AVS.  - oseltamivir (TAMIFLU) 75 MG capsule; Take 1 capsule (75 mg total) by mouth 2 (two) times daily.  Dispense: 10 capsule; Refill: 0 - guaiFENesin-codeine (CHERATUSSIN AC) 100-10 MG/5ML syrup; Take 10 mLs by mouth at bedtime as needed for cough or congestion.  Dispense: 120 mL; Refill: 0   . Reviewed expectations re: course of current medical issues. . Discussed self-management of symptoms. . Outlined signs and symptoms indicating need for more acute intervention. . Patient verbalized understanding and all questions were answered. Marland Kitchen. Health Maintenance issues including appropriate healthy diet, exercise, and smoking avoidance were discussed with patient. . See orders for this visit as documented in the electronic medical record. . Patient received an After Visit Summary.  Helane RimaErica Zuriel Roskos, DO [place] 07/24/2017

## 2017-07-24 NOTE — Patient Instructions (Signed)
You have the flu.  I am sending in a cough syrup and an antiviral (Tamiflu). If the Tamiflu is too expensive, you don't have to take it. It should lessen the flu by about one day.  Make sure to alternate Tylenol and Ibuprofen to keep your fever down. This will help more than anything.  STAY HYDRATED.  Please e-mail or call if you have any new or worsening symptoms.

## 2017-08-27 ENCOUNTER — Encounter: Payer: Self-pay | Admitting: Physician Assistant

## 2017-08-27 ENCOUNTER — Ambulatory Visit: Payer: BLUE CROSS/BLUE SHIELD | Admitting: Physician Assistant

## 2017-08-27 VITALS — BP 136/80 | HR 106 | Temp 98.4°F | Ht 65.5 in | Wt 183.2 lb

## 2017-08-27 DIAGNOSIS — R21 Rash and other nonspecific skin eruption: Secondary | ICD-10-CM

## 2017-08-27 MED ORDER — METHYLPREDNISOLONE ACETATE 80 MG/ML IJ SUSP
80.0000 mg | Freq: Once | INTRAMUSCULAR | Status: AC
  Administered 2017-08-27: 80 mg via INTRAMUSCULAR

## 2017-08-27 MED ORDER — PREDNISONE 5 MG PO TABS: ORAL_TABLET | ORAL | 0 refills | Status: AC

## 2017-08-27 NOTE — Progress Notes (Signed)
Thomas [name] is a 37 y.o. male here for a new problem.  I acted as a Neurosurgeon for [employer], PA-C Corky Mull, LPN  History of Present Illness:   Chief Complaint  Patient presents with  . Insect Bite  . Rash    Rash  This is a new problem. Episode onset: Pt c/o insect bite on right inner arm near elbow and now has a rash that is spreading. Pt said was spreading Hay on Tuesday. The problem has been gradually worsening since onset. The affected locations include the right arm and right elbow. The rash is characterized by redness, itchiness and blistering. He was exposed to an insect bite/sting (Spreading Hay). Pertinent negatives include no facial edema, fever, joint pain, shortness of breath or sore throat. Past treatments include anti-itch cream and antihistamine. The treatment provided no relief. There is no history of asthma or eczema.   He denies any streaking up his arm or fever. He has never spread hay before.   Past Medical History:  Diagnosis Date  . Chronic back pain   . Deaf      Social History   Socioeconomic History  . Marital status: Single    Spouse name: Not on file  . Number of children: Not on file  . Years of education: Not on file  . Highest education level: Not on file  Social Needs  . Financial resource strain: Not on file  . Food insecurity - worry: Not on file  . Food insecurity - inability: Not on file  . Transportation needs - medical: Not on file  . Transportation needs - non-medical: Not on file  Occupational History  . Not on file  Tobacco Use  . Smoking status: Never Smoker  . Smokeless tobacco: Never Used  Substance and Sexual Activity  . Alcohol use: No  . Drug use: No  . Sexual activity: Yes    Partners: Female    Birth control/protection: None  Other Topics Concern  . Not on file  Social History Narrative  . Not on file    History reviewed. No pertinent surgical history.  Family History  Problem Relation  Age of Onset  . Mental illness Mother   . Cancer Maternal Grandfather   . Diabetes Paternal Grandmother   . Hypertension Paternal Grandmother   . Stroke Paternal Grandmother     No Known Allergies  Current Medications:   Current Outpatient Medications:  .  amLODipine (NORVASC) 5 MG tablet, Take 1 tablet (5 mg total) by mouth daily., Disp: 90 tablet, Rfl: 2 .  predniSONE (DELTASONE) 5 MG tablet, Take 6 tablets (30 mg total) by mouth daily with breakfast for 2 days, THEN 5 tablets (25 mg total) daily with breakfast for 2 days, THEN 4 tablets (20 mg total) daily with breakfast for 2 days, THEN 3 tablets (15 mg total) daily with breakfast for 2 days, THEN 2 tablets (10 mg total) daily with breakfast for 2 days, THEN 1 tablet (5 mg total) daily with breakfast for 2 days., Disp: 42 tablet, Rfl: 0 .  simvastatin (ZOCOR) 20 MG tablet, Take 1 tablet (20 mg total) by mouth every evening., Disp: 90 tablet, Rfl: 3 .  traMADol (ULTRAM) 50 MG tablet, Take 1 tablet (50 mg total) by mouth every 8 (eight) hours as needed. (Patient not taking: Reported on 08/27/2017), Disp: 30 tablet, Rfl: 0   Review of Systems:   Review of Systems  Constitutional: Negative for fever.  HENT: Negative for  sore throat.   Respiratory: Negative for shortness of breath.   Musculoskeletal: Negative for joint pain.  Skin: Positive for rash.    Vitals:   Vitals:   08/27/17 1433  BP: 136/80  Pulse: (!) 106  Temp: 98.4 F (36.9 C)  TempSrc: Oral  SpO2: 98%  Weight: 183 lb 4 oz (83.1 kg)  Height: 5' 5.5" (1.664 m)     Body mass index is 30.03 kg/m.  Physical Exam:   Physical Exam  Constitutional: He appears well-developed. He is cooperative.  Non-toxic appearance. He does not have a sickly appearance. He does not appear ill. No distress.  Cardiovascular: Normal rate, regular rhythm, S1 normal, S2 normal, normal heart sounds and normal pulses.  No LE edema  Pulmonary/Chest: Effort normal and breath sounds normal.   Neurological: He is alert. GCS eye subscore is 4. GCS verbal subscore is 5. GCS motor subscore is 6.  Normal sensation to R and L upper extremities  Skin: Skin is warm, dry and intact.  Well circumscribed erythematous raised lesion to R antecubital with small scattered similar patches along L inner forearm; no evidence of streaking, warmth or discharge  Psychiatric: He has a normal mood and affect. His speech is normal and behavior is normal.  Nursing note and vitals reviewed.   Assessment and Plan:    Thomas [name] was seen today for insect bite and rash.  Diagnoses and all orders for this visit:  Rash No red flags on exam. Suspect irritant contact dermatitis from spreading hay. No evidence of infection at this time. Patient tolerated depomedrol injection well in the office. Start oral prednisone tomorrow with taper. Reviewed return precautions and discussed reasons to follow-up. Patient and wife confirmed understanding. -     methylPREDNISolone acetate (DEPO-MEDROL) injection 80 mg  Other orders -     predniSONE (DELTASONE) 5 MG tablet; Take 6 tablets (30 mg total) by mouth daily with breakfast for 2 days, THEN 5 tablets (25 mg total) daily with breakfast for 2 days, THEN 4 tablets (20 mg total) daily with breakfast for 2 days, THEN 3 tablets (15 mg total) daily with breakfast for 2 days, THEN 2 tablets (10 mg total) daily with breakfast for 2 days, THEN 1 tablet (5 mg total) daily with breakfast for 2 days.    . Reviewed expectations re: course of current medical issues. . Discussed self-management of symptoms. . Outlined signs and symptoms indicating need for more acute intervention. . Patient verbalized understanding and all questions were answered. . See orders for this visit as documented in the electronic medical record. . Patient received an After-Visit Summary.  CMA or LPN served as scribe during this visit. History, Physical, and Plan performed by medical provider.  Documentation and orders reviewed and attested to.  Jarold MottoSamantha Citlally Captain, PA-C

## 2017-08-27 NOTE — Patient Instructions (Signed)
Start the oral prednisone tomorrow.       Contact Dermatitis Dermatitis is redness, soreness, and swelling (inflammation) of the skin. Contact dermatitis is a reaction to certain substances that touch the skin. There are two types of contact dermatitis:  Irritant contact dermatitis. This type is caused by something that irritates your skin, such as dry hands from washing them too much. This type does not require previous exposure to the substance for a reaction to occur. This type is more common.  Allergic contact dermatitis. This type is caused by a substance that you are allergic to, such as a nickel allergy or poison ivy. This type only occurs if you have been exposed to the substance (allergen) before. Upon a repeat exposure, your body reacts to the substance. This type is less common.  What are the causes? Many different substances can cause contact dermatitis. Irritant contact dermatitis is most commonly caused by exposure to:  Makeup.  Soaps.  Detergents.  Bleaches.  Acids.  Metal salts, such as nickel.  Allergic contact dermatitis is most commonly caused by exposure to:  Poisonous plants.  Chemicals.  Jewelry.  Latex.  Medicines.  Preservatives in products, such as clothing.  What increases the risk? This condition is more likely to develop in:  People who have jobs that expose them to irritants or allergens.  People who have certain medical conditions, such as asthma or eczema.  What are the signs or symptoms? Symptoms of this condition may occur anywhere on your body where the irritant has touched you or is touched by you. Symptoms include:  Dryness or flaking.  Redness.  Cracks.  Itching.  Pain or a burning feeling.  Blisters.  Drainage of small amounts of blood or clear fluid from skin cracks.  With allergic contact dermatitis, there may also be swelling in areas such as the eyelids, mouth, or genitals. How is this diagnosed? This  condition is diagnosed with a medical history and physical exam. A patch skin test may be performed to help determine the cause. If the condition is related to your job, you may need to see an occupational medicine specialist. How is this treated? Treatment for this condition includes figuring out what caused the reaction and protecting your skin from further contact. Treatment may also include:  Steroid creams or ointments. Oral steroid medicines may be needed in more severe cases.  Antibiotics or antibacterial ointments, if a skin infection is present.  Antihistamine lotion or an antihistamine taken by mouth to ease itching.  A bandage (dressing).  Follow these instructions at home: Skin Care  Moisturize your skin as needed.  Apply cool compresses to the affected areas.  Try taking a bath with: ? Epsom salts. Follow the instructions on the packaging. You can get these at your local pharmacy or grocery store. ? Baking soda. Pour a small amount into the bath as directed by your health care provider. ? Colloidal oatmeal. Follow the instructions on the packaging. You can get this at your local pharmacy or grocery store.  Try applying baking soda paste to your skin. Stir water into baking soda until it reaches a paste-like consistency.  Do not scratch your skin.  Bathe less frequently, such as every other day.  Bathe in lukewarm water. Avoid using hot water. Medicines  Take or apply over-the-counter and prescription medicines only as told by your health care provider.  If you were prescribed an antibiotic medicine, take or apply your antibiotic as told by your health care  provider. Do not stop using the antibiotic even if your condition starts to improve. General instructions  Keep all follow-up visits as told by your health care provider. This is important.  Avoid the substance that caused your reaction. If you do not know what caused it, keep a journal to try to track what  caused it. Write down: ? What you eat. ? What cosmetic products you use. ? What you drink. ? What you wear in the affected area. This includes jewelry.  If you were given a dressing, take care of it as told by your health care provider. This includes when to change and remove it. Contact a health care provider if:  Your condition does not improve with treatment.  Your condition gets worse.  You have signs of infection such as swelling, tenderness, redness, soreness, or warmth in the affected area.  You have a fever.  You have new symptoms. Get help right away if:  You have a severe headache, neck pain, or neck stiffness.  You vomit.  You feel very sleepy.  You notice red streaks coming from the affected area.  Your bone or joint underneath the affected area becomes painful after the skin has healed.  The affected area turns darker.  You have difficulty breathing. This information is not intended to replace advice given to you by your health care provider. Make sure you discuss any questions you have with your health care provider. Document Released: 06/15/2000 Document Revised: 11/24/2015 Document Reviewed: 11/03/2014 Elsevier Interactive Patient Education  2018 [employer].

## 2017-10-16 ENCOUNTER — Ambulatory Visit: Payer: BLUE CROSS/BLUE SHIELD | Admitting: Family Medicine

## 2017-10-16 ENCOUNTER — Encounter: Payer: Self-pay | Admitting: Family Medicine

## 2017-10-16 VITALS — BP 138/102 | HR 89 | Temp 98.5°F | Ht 65.5 in | Wt 180.2 lb

## 2017-10-16 DIAGNOSIS — M546 Pain in thoracic spine: Secondary | ICD-10-CM

## 2017-10-16 DIAGNOSIS — G4730 Sleep apnea, unspecified: Secondary | ICD-10-CM | POA: Diagnosis not present

## 2017-10-16 DIAGNOSIS — I10 Essential (primary) hypertension: Secondary | ICD-10-CM | POA: Diagnosis not present

## 2017-10-16 DIAGNOSIS — E669 Obesity, unspecified: Secondary | ICD-10-CM | POA: Diagnosis not present

## 2017-10-16 DIAGNOSIS — E7800 Pure hypercholesterolemia, unspecified: Secondary | ICD-10-CM

## 2017-10-16 DIAGNOSIS — G8929 Other chronic pain: Secondary | ICD-10-CM | POA: Diagnosis not present

## 2017-10-16 DIAGNOSIS — Z23 Encounter for immunization: Secondary | ICD-10-CM | POA: Diagnosis not present

## 2017-10-16 LAB — LIPID PANEL
Cholesterol: 158 mg/dL (ref 0–200)
HDL: 40.9 mg/dL (ref >39.00)
NonHDL: 116.71
Total CHOL/HDL Ratio: 4
Triglycerides: 214 mg/dL — ABNORMAL HIGH (ref 0.0–149.0)
VLDL: 42.8 mg/dL — ABNORMAL HIGH (ref 0.0–40.0)

## 2017-10-16 LAB — COMPREHENSIVE METABOLIC PANEL
ALT: 23 U/L (ref 0–53)
AST: 15 U/L (ref 0–37)
Albumin: 4.6 g/dL (ref 3.5–5.2)
Alkaline Phosphatase: 132 U/L — ABNORMAL HIGH (ref 39–117)
BUN: 13 mg/dL (ref 6–23)
CO2: 29 mEq/L (ref 19–32)
Calcium: 9.8 mg/dL (ref 8.4–10.5)
Chloride: 100 mEq/L (ref 96–112)
Creatinine, Ser: 0.94 mg/dL (ref 0.40–1.50)
GFR: 96.07 mL/min (ref >60.00)
Glucose, Bld: 85 mg/dL (ref 70–99)
Potassium: 3.9 mEq/L (ref 3.5–5.1)
Sodium: 140 mEq/L (ref 135–145)
Total Bilirubin: 0.6 mg/dL (ref 0.2–1.2)
Total Protein: 7.3 g/dL (ref 6.0–8.3)

## 2017-10-16 LAB — LDL CHOLESTEROL, DIRECT: Direct LDL: 103 mg/dL

## 2017-10-16 MED ORDER — TRAMADOL HCL 50 MG PO TABS: 50.0000 mg | ORAL_TABLET | Freq: Three times a day (TID) | ORAL | 0 refills | Status: DC | PRN

## 2017-10-16 NOTE — Progress Notes (Signed)
Thomas [name] Eamonn Sermeno is a 37 y.o. male is here for follow up.  History of Present Illness:   Britt Bottom CMA acting as scribe for Dr. Earlene Plater.  HPI:   1. Pure hypercholesterolemia.tolerating statin without myalgias.  Due for cholesterol panel rechecked today.   2. Obesity (BMI 30.0-34.9).   Wt Readings from Last 3 Encounters:  10/16/17 180 lb 3.2 oz (81.7 kg)  08/27/17 183 lb 4 oz (83.1 kg)  07/24/17 185 lb (83.9 kg)    3. Essential hypertension.  Taking medication regularly without any issues.  Denies chest pain, shortness of breath, edema.   4. Need for diphtheria-tetanus-pertussis (Tdap) vaccine.   5. Sleep-disordered breathing. Noted snoring.  Patient with chronically elevated diastolic blood pressure.  Open to sleep study.   6. Chronic midline thoracic back pain.  ongoing.  Tramadol helps as needed.  Needs refill today.   There are no preventive care reminders to display for this patient. Depression screen PHQ 2/9 07/18/2017  Decreased Interest 0  Down, Depressed, Hopeless 0  PHQ - 2 Score 0   PMHx, SurgHx, SocialHx, FamHx, Medications, and Allergies were reviewed in the Visit Navigator and updated as appropriate.   Patient Active Problem List   Diagnosis Date Noted  . Deaf 02/24/2016  . Back pain 02/24/2016  . Left foot pain 02/24/2016   Social History   Tobacco Use  . Smoking status: Never Smoker  . Smokeless tobacco: Never Used  Substance Use Topics  . Alcohol use: No  . Drug use: No   Current Medications and Allergies:   .  amLODipine (NORVASC) 5 MG tablet, Take 1 tablet (5 mg total) by mouth daily., Disp: 90 tablet, Rfl: 2 .  simvastatin (ZOCOR) 20 MG tablet, Take 1 tablet (20 mg total) by mouth every evening., Disp: 90 tablet, Rfl: 3 .  traMADol (ULTRAM) 50 MG tablet, Take 1 tablet (50 mg total) by mouth every 8 (eight) hours as needed., Disp: 30 tablet, Rfl: 0  No Known Allergies   Review of Systems   Pertinent items are noted in the  HPI. Otherwise, ROS is negative.  Vitals:   Vitals:   10/16/17 0858  BP: (!) 138/102  Pulse: 89  Temp: 98.5 F (36.9 C)  TempSrc: Oral  SpO2: 98%  Weight: 180 lb 3.2 oz (81.7 kg)  Height: 5' 5.5" (1.664 m)     Body mass index is 29.53 kg/m.   Physical Exam:   Physical Exam  Constitutional: He is oriented to person, place, and time. He appears well-developed and well-nourished. No distress.  HENT:  Head: Normocephalic and atraumatic.  Right Ear: External ear normal.  Left Ear: External ear normal.  Nose: Nose normal.  Mouth/Throat: Oropharynx is clear and moist.  Eyes: Pupils are equal, round, and reactive to light. Conjunctivae and EOM are normal.  Neck: Normal range of motion. Neck supple.  Cardiovascular: Normal rate, regular rhythm, normal heart sounds and intact distal pulses.  Pulmonary/Chest: Effort normal and breath sounds normal.  Abdominal: Soft. Bowel sounds are normal.  Musculoskeletal: Normal range of motion.  Neurological: He is alert and oriented to person, place, and time.  Skin: Skin is warm and dry.  Psychiatric: He has a normal mood and affect. His behavior is normal. Judgment and thought content normal.  Nursing note and vitals reviewed.   Assessment and Plan:   Diagnoses and all orders for this visit:  Pure hypercholesterolemia Comments: Recheck labs as below.  Continue current treatment for now. Orders: -  Comprehensive metabolic panel -     Lipid panel  Obesity (BMI 30.0-34.9) Comments: Slow weight loss.  Encouraged continued exercise and healthy food choices.  Essential hypertension Comments: Continue current treatment for the moment.  Will order sleep study.  Need for diphtheria-tetanus-pertussis (Tdap) vaccine Comments: Vaccination today. Orders: -     Tdap vaccine greater than or equal to 7yo IM  Sleep-disordered breathing Comments: Sleep study as below. Orders: -     Home sleep test  Chronic midline thoracic back  pain Comments: Uses tramadol 1-2 per month. Uses NSAIDs 1-2 per week. Orders: -     traMADol (ULTRAM) 50 MG tablet; Take 1 tablet (50 mg total) by mouth every 8 (eight) hours as needed.  . Reviewed expectations re: course of current medical issues. . Discussed self-management of symptoms. . Outlined signs and symptoms indicating need for more acute intervention. . Patient verbalized understanding and all questions were answered. Marland Kitchen. Health Maintenance issues including appropriate healthy diet, exercise, and smoking avoidance were discussed with patient. . See orders for this visit as documented in the electronic medical record. . Patient received an After Visit Summary.  Helane RimaErica Suraj Ramdass, DO [place], [facility] 10/18/2017  Future Appointments  Date Time Provider Department Center  01/15/2018  9:00 AM Helane RimaWallace, Dakari Stabler, DO [facility]

## 2017-10-18 ENCOUNTER — Encounter: Payer: Self-pay | Admitting: Family Medicine

## 2018-01-15 ENCOUNTER — Encounter: Payer: Self-pay | Admitting: Family Medicine

## 2018-01-15 ENCOUNTER — Ambulatory Visit: Payer: BLUE CROSS/BLUE SHIELD | Admitting: Family Medicine

## 2018-01-15 VITALS — BP 126/80 | HR 96 | Temp 98.9°F | Ht 65.5 in | Wt 179.4 lb

## 2018-01-15 DIAGNOSIS — E669 Obesity, unspecified: Secondary | ICD-10-CM | POA: Diagnosis not present

## 2018-01-15 DIAGNOSIS — E7800 Pure hypercholesterolemia, unspecified: Secondary | ICD-10-CM | POA: Diagnosis not present

## 2018-01-15 DIAGNOSIS — G8929 Other chronic pain: Secondary | ICD-10-CM

## 2018-01-15 DIAGNOSIS — M546 Pain in thoracic spine: Secondary | ICD-10-CM

## 2018-01-15 DIAGNOSIS — I10 Essential (primary) hypertension: Secondary | ICD-10-CM | POA: Diagnosis not present

## 2018-01-15 LAB — COMPREHENSIVE METABOLIC PANEL
ALT: 25 U/L (ref 0–53)
AST: 20 U/L (ref 0–37)
Albumin: 4.7 g/dL (ref 3.5–5.2)
Alkaline Phosphatase: 125 U/L — ABNORMAL HIGH (ref 39–117)
BUN: 14 mg/dL (ref 6–23)
CO2: 29 mEq/L (ref 19–32)
Calcium: 9.9 mg/dL (ref 8.4–10.5)
Chloride: 101 mEq/L (ref 96–112)
Creatinine, Ser: 1.08 mg/dL (ref 0.40–1.50)
GFR: 81.74 mL/min (ref >60.00)
Glucose, Bld: 92 mg/dL (ref 70–99)
Potassium: 3.9 mEq/L (ref 3.5–5.1)
Sodium: 141 mEq/L (ref 135–145)
Total Bilirubin: 0.7 mg/dL (ref 0.2–1.2)
Total Protein: 7.6 g/dL (ref 6.0–8.3)

## 2018-01-15 LAB — LIPID PANEL
Cholesterol: 190 mg/dL (ref 0–200)
HDL: 45.8 mg/dL (ref >39.00)
LDL Cholesterol: 111 mg/dL — ABNORMAL HIGH (ref 0–99)
NonHDL: 143.97
Total CHOL/HDL Ratio: 4
Triglycerides: 167 mg/dL — ABNORMAL HIGH (ref 0.0–149.0)
VLDL: 33.4 mg/dL (ref 0.0–40.0)

## 2018-01-15 MED ORDER — TRAMADOL HCL 50 MG PO TABS: 50.0000 mg | ORAL_TABLET | Freq: Three times a day (TID) | ORAL | 0 refills | Status: DC | PRN

## 2018-01-15 NOTE — Progress Notes (Signed)
Thomas [name] is a 37 y.o. male is here for follow up.  History of Present Illness:   Thomas MortJoEllen [name], CMA acting as scribe for Dr. Helane RimaErica Merin [name].   HPI: Patient in office today for follow up. No new problems. He has changed his diet to less fried food, junk food, and eat more greens. After diet change he has noticed a decrease in his headaches. He as not made any changes in exercise due to heat.   There are no preventive care reminders to display for this patient.   Depression screen PHQ 2/9 07/18/2017  Decreased Interest 0  Down, Depressed, Hopeless 0  PHQ - 2 Score 0   PMHx, SurgHx, SocialHx, FamHx, Medications, and Allergies were reviewed in the Visit Navigator and updated as appropriate.   Patient Active Problem List   Diagnosis Date Noted  . Deaf 02/24/2016  . Back pain 02/24/2016  . Left foot pain 02/24/2016   Social History   Tobacco Use  . Smoking status: Never Smoker  . Smokeless tobacco: Never Used  Substance Use Topics  . Alcohol use: No  . Drug use: No   Current Medications and Allergies:   Current Outpatient Medications:  .  amLODipine (NORVASC) 5 MG tablet, Take 1 tablet (5 mg total) by mouth daily., Disp: 90 tablet, Rfl: 2 .  simvastatin (ZOCOR) 20 MG tablet, Take 1 tablet (20 mg total) by mouth every evening., Disp: 90 tablet, Rfl: 3 .  traMADol (ULTRAM) 50 MG tablet, Take 1 tablet (50 mg total) by mouth every 8 (eight) hours as needed. FOR CHRONIC PAIN, Disp: 30 tablet, Rfl: 0  No Known Allergies Review of Systems   Pertinent items are noted in the HPI. Otherwise, ROS is negative.  Vitals:   Vitals:   01/15/18 0900  BP: 126/80  Pulse: 96  Temp: 98.9 F (37.2 C)  TempSrc: Oral  SpO2: 96%  Weight: 179 lb 6.4 oz (81.4 kg)  Height: 5' 5.5" (1.664 m)     Body mass index is 29.4 kg/m.  Physical Exam:   Physical Exam  Constitutional: He is oriented to person, place, and time. He appears well-developed and well-nourished. No  distress.  HENT:  Head: Normocephalic and atraumatic.  Right Ear: External ear normal.  Left Ear: External ear normal.  Nose: Nose normal.  Mouth/Throat: Oropharynx is clear and moist.  Eyes: Pupils are equal, round, and reactive to light. Conjunctivae and EOM are normal.  Neck: Normal range of motion. Neck supple.  Cardiovascular: Normal rate, regular rhythm, normal heart sounds and intact distal pulses.  Pulmonary/Chest: Effort normal and breath sounds normal.  Abdominal: Soft. Bowel sounds are normal.  Musculoskeletal: Normal range of motion.  Neurological: He is alert and oriented to person, place, and time.  Skin: Skin is warm and dry.  Psychiatric: He has a normal mood and affect. His behavior is normal. Judgment and thought content normal.  Nursing note and vitals reviewed.  Assessment and Plan:   Thomas [name] was seen today for follow-up.  Diagnoses and all orders for this visit:  Pure hypercholesterolemia Comments: Doing well. Compliant. No myalgias. Due for CMP and FLP. Orders: -     Comprehensive metabolic panel -     Lipid panel  Essential hypertension Comments: Compliant. No CP, SOB, N/V, edema. Continue current treatment.   Obesity (BMI 30.0-34.9) Comments: Watching diet. Not exercising due to heat.   Chronic midline thoracic back pain Comments: Uses tramadol 1-2 per month. Uses NSAIDs 1-2 per week.  Orders: -     traMADol (ULTRAM) 50 MG tablet; Take 1 tablet (50 mg total) by mouth every 8 (eight) hours as needed. FOR CHRONIC PAIN   . Reviewed expectations re: course of current medical issues. . Discussed self-management of symptoms. . Outlined signs and symptoms indicating need for more acute intervention. . Patient verbalized understanding and all questions were answered. Marland Kitchen Health Maintenance issues including appropriate healthy diet, exercise, and smoking avoidance were discussed with patient. . See orders for this visit as documented in the electronic  medical record. . Patient received an After Visit Summary.  CMA served as Neurosurgeon during this visit. History, Physical, and Plan performed by medical provider. The above documentation has been reviewed and is accurate and complete. Thomas [name], D.O.  Thomas Rima, DO [place], [facility] 01/15/2018

## 2018-04-11 ENCOUNTER — Other Ambulatory Visit: Payer: Self-pay | Admitting: Family Medicine

## 2018-04-11 DIAGNOSIS — I10 Essential (primary) hypertension: Secondary | ICD-10-CM

## 2018-05-21 ENCOUNTER — Ambulatory Visit: Payer: BLUE CROSS/BLUE SHIELD | Admitting: Family Medicine

## 2018-05-21 ENCOUNTER — Encounter: Payer: Self-pay | Admitting: Family Medicine

## 2018-05-21 VITALS — BP 144/86 | HR 90 | Temp 98.5°F | Ht 65.5 in | Wt 184.4 lb

## 2018-05-21 DIAGNOSIS — J069 Acute upper respiratory infection, unspecified: Secondary | ICD-10-CM | POA: Diagnosis not present

## 2018-05-21 MED ORDER — HYDROCODONE-HOMATROPINE 5-1.5 MG/5ML PO SYRP: 5.0000 mL | ORAL_SOLUTION | Freq: Every evening | ORAL | 0 refills | Status: DC | PRN

## 2018-05-21 MED ORDER — AZITHROMYCIN 250 MG PO TABS: ORAL_TABLET | ORAL | 0 refills | Status: DC

## 2018-05-21 MED ORDER — PREDNISONE 5 MG PO TABS: ORAL_TABLET | ORAL | 0 refills | Status: DC

## 2018-05-21 NOTE — Progress Notes (Signed)
Thomas [name] is a 37 y.o. male here for an acute visit.  History of Present Illness:   Thomas [name], CMA acting as scribe for Dr. Helane [name].   Cough  This is a new problem. The current episode started in the past 7 days. The problem has been gradually worsening. The problem occurs constantly. The cough is productive of sputum (clear ). Associated symptoms include chest pain, a fever, nasal congestion and a sore throat. Pertinent negatives include no ear congestion or ear pain. Associated symptoms comments: Chest pain with coughing was bad over weekend but better today  Fever: was warm over weekend but did not check temp. . Nothing aggravates the symptoms. He has tried OTC cough suppressant (Therma flu, dayquil ) for the symptoms. There is no history of asthma or COPD.   PMHx, SurgHx, SocialHx, Medications, and Allergies were reviewed in the Visit Navigator and updated as appropriate.  Current Medications:   .  amLODipine (NORVASC) 5 MG tablet, TAKE 1 TABLET BY MOUTH EVERY DAY, Disp: 90 tablet, Rfl: 1 .  simvastatin (ZOCOR) 20 MG tablet, Take 1 tablet (20 mg total) by mouth every evening., Disp: 90 tablet, Rfl: 3 .  traMADol (ULTRAM) 50 MG tablet, Take 1 tablet (50 mg total) by mouth every 8 (eight) hours as needed. FOR CHRONIC PAIN, Disp: 30 tablet, Rfl: 0   No Known Allergies   Review of Systems:   Pertinent items are noted in the HPI. Otherwise, ROS is negative.  Vitals:   Vitals:   05/21/18 0851  BP: (!) 144/86  Pulse: 90  Temp: 98.5 F (36.9 C)  TempSrc: Oral  SpO2: 96%  Weight: 184 lb 6.4 oz (83.6 kg)  Height: 5' 5.5" (1.664 m)     Body mass index is 30.22 kg/m.  Physical Exam:   Physical Exam  Constitutional: He is oriented to person, place, and time. He appears well-developed and well-nourished. No distress.  HENT:  Head: Normocephalic and atraumatic.  Right Ear: External ear normal. Tympanic membrane is injected.  Left Ear: External  ear normal.  Nose: Nose normal.  Mouth/Throat: Oropharynx is clear and moist.  Eyes: Pupils are equal, round, and reactive to light. Conjunctivae and EOM are normal.  Neck: Normal range of motion. Neck supple.  Cardiovascular: Normal rate, regular rhythm, normal heart sounds and intact distal pulses.  Pulmonary/Chest: Effort normal and breath sounds normal.  Abdominal: Soft. Bowel sounds are normal.  Musculoskeletal: Normal range of motion.  Neurological: He is alert and oriented to person, place, and time.  Skin: Skin is warm and dry.  Psychiatric: He has a normal mood and affect. His behavior is normal. Judgment and thought content normal.  Nursing note and vitals reviewed.  Assessment and Plan:   Thomas [name] was seen today for cough.  Diagnoses and all orders for this visit:  Viral upper respiratory tract infection Comments: Patient will be traveling [place]. Provided safety net Rx Abx to hold with instructions for use. Orders: -     predniSONE (DELTASONE) 5 MG tablet; 6-5-4-3-2-1-off -     HYDROcodone-homatropine (HYCODAN) 5-1.5 MG/5ML syrup; Take 5 mLs by mouth at bedtime as needed for cough.  Other orders -     azithromycin (ZITHROMAX) 250 MG tablet; 2 po on day one then one po daily until gone   . Reviewed expectations re: course of current medical issues. . Discussed self-management of symptoms. . Outlined signs and symptoms indicating need for more acute intervention. . Patient verbalized understanding  and all questions were answered. Marland Kitchen. Health Maintenance issues including appropriate healthy diet, exercise, and smoking avoidance were discussed with patient. . See orders for this visit as documented in the electronic medical record. . Patient received an After Visit Summary.  CMA served as Neurosurgeonscribe during this visit. History, Physical, and Plan performed by medical provider. The above documentation has been reviewed and is accurate and complete. Thomas [name],  D.O.  Thomas RimaErica Kleber Crean, DO [place] 05/21/2018

## 2018-07-07 ENCOUNTER — Encounter: Payer: Self-pay | Admitting: Family Medicine

## 2018-07-07 DIAGNOSIS — J069 Acute upper respiratory infection, unspecified: Secondary | ICD-10-CM

## 2018-07-07 MED ORDER — AZITHROMYCIN 250 MG PO TABS: ORAL_TABLET | ORAL | 0 refills | Status: DC

## 2018-07-07 MED ORDER — HYDROCODONE-HOMATROPINE 5-1.5 MG/5ML PO SYRP: 5.0000 mL | ORAL_SOLUTION | Freq: Every evening | ORAL | 0 refills | Status: DC | PRN

## 2018-07-07 MED ORDER — PREDNISONE 5 MG PO TABS: ORAL_TABLET | ORAL | 0 refills | Status: DC

## 2018-07-17 NOTE — Progress Notes (Signed)
Thomas [name] is a 38 y.o. male is here for follow up.  History of Present Illness:   Barnie Mort, CMA acting as scribe for Dr. Helane Rima.   HPI: Patient in office for follow up. He is going out of the country in a few months. He has some questions about how to take his medications. He also has some questions about lung issues he had with trip last time.   Hypertension:  He is currently taking Amlodipine daily with no problems.  Review: taking medications as instructed, no medication side effects noted, no TIAs, no chest pain on exertion, no dyspnea on exertion, no swelling of ankles. Smoker: No.   BP Readings from Last 3 Encounters:  07/18/18 136/88  05/21/18 (!) 144/86  01/15/18 126/80   Lab Results  Component Value Date   CREATININE 1.08 01/15/2018   CREATININE 0.94 10/16/2017   CREATININE 0.86 07/18/2017     Hyperlipidemia: He is taking Zocor. Is the patient taking medications without problems? Does the patient complain of muscle aches?  Yes. Trying to exercise on a regular basis? Yes. Compliant with diet? Yes.  Lab Results  Component Value Date   CHOL 190 01/15/2018   HDL 45.80 01/15/2018   LDLCALC 111 (H) 01/15/2018   LDLDIRECT 103.0 10/16/2017   TRIG 167.0 (H) 01/15/2018   CHOLHDL 4 01/15/2018   Lab Results  Component Value Date   ALT 25 01/15/2018   AST 20 01/15/2018   ALKPHOS 125 (H) 01/15/2018   BILITOT 0.7 01/15/2018     There are no preventive care reminders to display for this patient.   Depression screen [facility] 2/9 07/18/2018 07/18/2017  Decreased Interest 0 0  Down, Depressed, Hopeless 0 0  PHQ - 2 Score 0 0  Altered sleeping 0 -  Tired, decreased energy 0 -  Change in appetite 0 -  Feeling bad or failure about yourself  0 -  Trouble concentrating 0 -  Moving slowly or fidgety/restless 0 -  PHQ-9 Score 0 -  Difficult doing work/chores Not difficult at all -   PMHx, SurgHx, SocialHx, FamHx, Medications, and Allergies were  reviewed in the Visit Navigator and updated as appropriate.   Patient Active Problem List   Diagnosis Date Noted  . Essential hypertension 07/19/2018  . Pure hypercholesterolemia 07/18/2018  . Deaf 02/24/2016  . Back pain 02/24/2016  . Left foot pain 02/24/2016   Social History   Tobacco Use  . Smoking status: Never Smoker  . Smokeless tobacco: Never Used  Substance Use Topics  . Alcohol use: No  . Drug use: No   Current Medications and Allergies:   .  amLODipine (NORVASC) 5 MG tablet, TAKE 1 TABLET BY MOUTH EVERY DAY, Disp: 90 tablet, Rfl: 1 .  simvastatin (ZOCOR) 20 MG tablet, Take 1 tablet (20 mg total) by mouth every evening., Disp: 90 tablet, Rfl: 3 .  traMADol (ULTRAM) 50 MG tablet, Take 1 tablet (50 mg total) by mouth every 8 (eight) hours as needed. FOR CHRONIC PAIN, Disp: 30 tablet, Rfl: 0  No Known Allergies   Review of Systems   Pertinent items are noted in the HPI. Otherwise, a complete ROS is negative.  Vitals:   Vitals:   07/18/18 0906  BP: 136/88  Pulse: 83  Temp: 98.3 F (36.8 C)  TempSrc: Oral  SpO2: 97%  Weight: 184 lb 12.8 oz (83.8 kg)  Height: 5\' 6"  (1.676 m)     Body mass index is 29.83 kg/m.  Physical Exam:   Physical Exam Vitals signs and nursing note reviewed.  Constitutional:      General: He is not in acute distress.    Appearance: He is well-developed.  HENT:     Head: Normocephalic and atraumatic.     Right Ear: External ear normal.     Left Ear: External ear normal.     Nose: Nose normal.  Eyes:     Conjunctiva/sclera: Conjunctivae normal.     Pupils: Pupils are equal, round, and reactive to light.  Neck:     Musculoskeletal: Normal range of motion and neck supple.  Cardiovascular:     Rate and Rhythm: Normal rate and regular rhythm.     Heart sounds: Normal heart sounds.  Pulmonary:     Effort: Pulmonary effort is normal.     Breath sounds: Normal breath sounds.  Abdominal:     General: Bowel sounds are normal.      Palpations: Abdomen is soft.  Musculoskeletal: Normal range of motion.  Skin:    General: Skin is warm and dry.  Neurological:     Mental Status: He is alert and oriented to person, place, and time.  Psychiatric:        Behavior: Behavior normal.        Thought Content: Thought content normal.        Judgment: Judgment normal.     Assessment and Plan:   [name] was seen today for follow-up.  Diagnoses and all orders for this visit:  Travel advice encounter -     predniSONE (DELTASONE) 5 MG tablet; Take 1 tablet (5 mg total) by mouth daily with breakfast. 6-5-4-3-2-1 -     doxycycline (VIBRAMYCIN) 100 MG capsule; Take 1 capsule (100 mg total) by mouth 2 (two) times daily. -     ondansetron (ZOFRAN ODT) 4 MG disintegrating tablet; Take 1 tablet (4 mg total) by mouth every 8 (eight) hours as needed for nausea or vomiting. -     ALPRAZolam (XANAX) 0.5 MG tablet; Take 1 tablet (0.5 mg total) by mouth 2 (two) times daily as needed for anxiety.  Essential hypertension -     amLODipine (NORVASC) 5 MG tablet; Take 1 tablet (5 mg total) by mouth daily.  Pure hypercholesterolemia -     simvastatin (ZOCOR) 20 MG tablet; Take 1 tablet (20 mg total) by mouth every evening.  Chronic midline thoracic back pain Comments: Uses tramadol 1-2 per month. Uses NSAIDs 1-2 per week. Orders: -     traMADol (ULTRAM) 50 MG tablet; Take 1 tablet (50 mg total) by mouth every 8 (eight) hours as needed. FOR CHRONIC PAIN    . Orders and follow up as documented in EpicCare, reviewed diet, exercise and weight control, cardiovascular risk and specific lipid/LDL goals reviewed, reviewed medications and side effects in detail.  . Reviewed expectations re: course of current medical issues. . Outlined signs and symptoms indicating need for more acute intervention. . Patient verbalized understanding and all questions were answered. . Patient received an After Visit Summary.  CMA served as Neurosurgeonscribe during this  visit. History, Physical, and Plan performed by medical provider. The above documentation has been reviewed and is accurate and complete. Helane RimaErica Brittin Belnap, D.O.   Helane RimaErica Dereonna Lensing, DO [place], [facility] 07/19/2018

## 2018-07-18 ENCOUNTER — Encounter: Payer: Self-pay | Admitting: Family Medicine

## 2018-07-18 ENCOUNTER — Ambulatory Visit: Payer: BLUE CROSS/BLUE SHIELD | Admitting: Family Medicine

## 2018-07-18 VITALS — BP 136/88 | HR 83 | Temp 98.3°F | Ht 66.0 in | Wt 184.8 lb

## 2018-07-18 DIAGNOSIS — Z7184 Encounter for health counseling related to travel: Secondary | ICD-10-CM

## 2018-07-18 DIAGNOSIS — M546 Pain in thoracic spine: Secondary | ICD-10-CM

## 2018-07-18 DIAGNOSIS — I10 Essential (primary) hypertension: Secondary | ICD-10-CM | POA: Diagnosis not present

## 2018-07-18 DIAGNOSIS — E7800 Pure hypercholesterolemia, unspecified: Secondary | ICD-10-CM

## 2018-07-18 DIAGNOSIS — G8929 Other chronic pain: Secondary | ICD-10-CM

## 2018-07-18 MED ORDER — PREDNISONE 5 MG PO TABS: 5.0000 mg | ORAL_TABLET | Freq: Every day | ORAL | 0 refills | Status: DC

## 2018-07-18 MED ORDER — ALPRAZOLAM 0.5 MG PO TABS: 0.5000 mg | ORAL_TABLET | Freq: Two times a day (BID) | ORAL | 0 refills | Status: DC | PRN

## 2018-07-18 MED ORDER — DOXYCYCLINE HYCLATE 100 MG PO CAPS: 100.0000 mg | ORAL_CAPSULE | Freq: Two times a day (BID) | ORAL | 0 refills | Status: DC

## 2018-07-18 MED ORDER — AMLODIPINE BESYLATE 5 MG PO TABS: 5.0000 mg | ORAL_TABLET | Freq: Every day | ORAL | 1 refills | Status: DC

## 2018-07-18 MED ORDER — SIMVASTATIN 20 MG PO TABS: 20.0000 mg | ORAL_TABLET | Freq: Every evening | ORAL | 3 refills | Status: DC

## 2018-07-18 MED ORDER — TRAMADOL HCL 50 MG PO TABS: 50.0000 mg | ORAL_TABLET | Freq: Three times a day (TID) | ORAL | 0 refills | Status: DC | PRN

## 2018-07-18 MED ORDER — ONDANSETRON 4 MG PO TBDP: 4.0000 mg | ORAL_TABLET | Freq: Three times a day (TID) | ORAL | 0 refills | Status: DC | PRN

## 2018-07-18 NOTE — Patient Instructions (Addendum)
I hope that you have a great flight and trip!  Keep the Zofran and Xanax with your carry on luggage in case you need them. The others can be stored in your checked bags.

## 2018-07-19 DIAGNOSIS — I10 Essential (primary) hypertension: Secondary | ICD-10-CM | POA: Insufficient documentation

## 2018-07-22 ENCOUNTER — Encounter: Payer: Self-pay | Admitting: Physician Assistant

## 2018-08-15 ENCOUNTER — Encounter: Payer: Self-pay | Admitting: Physician Assistant

## 2018-08-15 ENCOUNTER — Ambulatory Visit: Payer: BLUE CROSS/BLUE SHIELD | Admitting: Physician Assistant

## 2018-08-15 VITALS — BP 140/80 | HR 87 | Temp 98.6°F | Ht 66.0 in | Wt 188.5 lb

## 2018-08-15 DIAGNOSIS — R6889 Other general symptoms and signs: Secondary | ICD-10-CM | POA: Diagnosis not present

## 2018-08-15 DIAGNOSIS — R05 Cough: Secondary | ICD-10-CM

## 2018-08-15 DIAGNOSIS — R059 Cough, unspecified: Secondary | ICD-10-CM

## 2018-08-15 LAB — POC INFLUENZA A&B (BINAX/QUICKVUE)
Influenza A, POC: NEGATIVE
Influenza B, POC: NEGATIVE

## 2018-08-15 MED ORDER — METHYLPREDNISOLONE ACETATE 80 MG/ML IJ SUSP
80.0000 mg | Freq: Once | INTRAMUSCULAR | Status: AC
  Administered 2018-08-15: 80 mg via INTRAMUSCULAR

## 2018-08-15 MED ORDER — AZITHROMYCIN 250 MG PO TABS: ORAL_TABLET | ORAL | 0 refills | Status: DC

## 2018-08-15 MED ORDER — HYDROCODONE-HOMATROPINE 5-1.5 MG/5ML PO SYRP: 5.0000 mL | ORAL_SOLUTION | Freq: Three times a day (TID) | ORAL | 0 refills | Status: DC | PRN

## 2018-08-15 NOTE — Patient Instructions (Signed)
It was great to see you!  Flu test negative, you likely have bronchitis.  Start oral azithromycin antibiotic.  Delsym cough syrup during the day (buy this over the counter, generic is fine)  Hycodan cough syrup at night -- may make drowsy.  Push fluids and get plenty of rest. Please return if you are not improving as expected, or if you have high fevers (>101.5) or difficulty swallowing or worsening productive cough.  Call clinic with questions.  I hope you start feeling better soon!

## 2018-08-15 NOTE — Progress Notes (Signed)
Thomas [name] is a 38 y.o. male here for a new problem.  I acted as a Neurosurgeon for [employer], PA-C Corky Mull, LPN  History of Present Illness:   Chief Complaint  Patient presents with  . Cough    Cough  This is a new problem. Episode onset: Started on Tuesday. The problem has been gradually worsening. The problem occurs constantly. The cough is productive of sputum (brown ). Pertinent negatives include no chills, fever, nasal congestion, postnasal drip or shortness of breath. The symptoms are aggravated by lying down. He has tried OTC cough suppressant (Robitussin, Alka seltzer) for the symptoms. The treatment provided mild relief. There is no history of asthma or COPD.   He denies SOB or fever.   Past Medical History:  Diagnosis Date  . Chronic back pain   . Deaf      Social History   Socioeconomic History  . Marital status: Single    Spouse name: Not on file  . Number of children: Not on file  . Years of education: Not on file  . Highest education level: Not on file  Occupational History  . Not on file  Social Needs  . Financial resource strain: Not on file  . Food insecurity:    Worry: Not on file    Inability: Not on file  . Transportation needs:    Medical: Not on file    Non-medical: Not on file  Tobacco Use  . Smoking status: Never Smoker  . Smokeless tobacco: Never Used  Substance and Sexual Activity  . Alcohol use: No  . Drug use: No  . Sexual activity: Yes    Partners: Female    Birth control/protection: None  Lifestyle  . Physical activity:    Days per week: Not on file    Minutes per session: Not on file  . Stress: Not on file  Relationships  . Social connections:    Talks on phone: Not on file    Gets together: Not on file    Attends religious service: Not on file    Active member of club or organization: Not on file    Attends meetings of clubs or organizations: Not on file    Relationship status: Not on file  .  Intimate partner violence:    Fear of current or ex partner: Not on file    Emotionally abused: Not on file    Physically abused: Not on file    Forced sexual activity: Not on file  Other Topics Concern  . Not on file  Social History Narrative  . Not on file    History reviewed. No pertinent surgical history.  Family History  Problem Relation Age of Onset  . Mental illness Mother   . Cancer Maternal Grandfather   . Diabetes Paternal Grandmother   . Hypertension Paternal Grandmother   . Stroke Paternal Grandmother     No Known Allergies  Current Medications:   Current Outpatient Medications:  .  amLODipine (NORVASC) 5 MG tablet, Take 1 tablet (5 mg total) by mouth daily., Disp: 90 tablet, Rfl: 1 .  simvastatin (ZOCOR) 20 MG tablet, Take 1 tablet (20 mg total) by mouth every evening., Disp: 90 tablet, Rfl: 3 .  traMADol (ULTRAM) 50 MG tablet, Take 1 tablet (50 mg total) by mouth every 8 (eight) hours as needed. FOR CHRONIC PAIN, Disp: 30 tablet, Rfl: 0 .  azithromycin (ZITHROMAX) 250 MG tablet, Take two tablets on day 1, then one  daily x 4 days, Disp: 6 tablet, Rfl: 0 .  HYDROcodone-homatropine (HYCODAN) 5-1.5 MG/5ML syrup, Take 5 mLs by mouth every 8 (eight) hours as needed for cough., Disp: 120 mL, Rfl: 0   Review of Systems:   Review of Systems  Constitutional: Negative for chills and fever.  HENT: Negative for postnasal drip.   Respiratory: Positive for cough. Negative for shortness of breath.     Vitals:   Vitals:   08/15/18 0815  BP: 140/80  Pulse: 87  Temp: 98.6 F (37 C)  TempSrc: Oral  SpO2: 97%  Weight: 188 lb 8 oz (85.5 kg)  Height: 5\' 6"  (1.676 m)     Body mass index is 30.42 kg/m.  Physical Exam:   Physical Exam Vitals signs and nursing note reviewed.  Constitutional:      General: He is not in acute distress.    Appearance: He is well-developed. He is not ill-appearing or toxic-appearing.  HENT:     Head: Normocephalic and atraumatic.      Right Ear: Tympanic membrane, ear canal and external ear normal. Tympanic membrane is not erythematous, retracted or bulging.     Left Ear: Tympanic membrane, ear canal and external ear normal. Tympanic membrane is not erythematous, retracted or bulging.     Nose: Mucosal edema, congestion and rhinorrhea present.     Right Sinus: No maxillary sinus tenderness or frontal sinus tenderness.     Left Sinus: No maxillary sinus tenderness or frontal sinus tenderness.     Mouth/Throat:     Lips: Pink.     Mouth: Mucous membranes are moist.     Pharynx: Uvula midline. Posterior oropharyngeal erythema present.     Tonsils: Swelling: 0 on the right. 0 on the left.  Eyes:     General: Lids are normal.     Conjunctiva/sclera: Conjunctivae normal.  Neck:     Trachea: Trachea normal.  Cardiovascular:     Rate and Rhythm: Normal rate and regular rhythm.     Heart sounds: Normal heart sounds, S1 normal and S2 normal.  Pulmonary:     Effort: Pulmonary effort is normal.     Breath sounds: Examination of the right-lower field reveals decreased breath sounds. Examination of the left-lower field reveals decreased breath sounds. Decreased breath sounds present. No wheezing, rhonchi or rales.  Lymphadenopathy:     Cervical: No cervical adenopathy.  Skin:    General: Skin is warm and dry.  Neurological:     Mental Status: He is alert.  Psychiatric:        Speech: Speech normal.        Behavior: Behavior normal. Behavior is cooperative.      Results for orders placed or performed in visit on 08/15/18  POC Influenza A&B(BINAX/QUICKVUE)  Result Value Ref Range   Influenza A, POC Negative Negative   Influenza B, POC Negative Negative     Assessment and Plan:   Jathan was seen today for cough.  Diagnoses and all orders for this visit:  Cough -     methylPREDNISolone acetate (DEPO-MEDROL) injection 80 mg  Flu-like symptoms -     POC Influenza A&B(BINAX/QUICKVUE)  Other orders -      HYDROcodone-homatropine (HYCODAN) 5-1.5 MG/5ML syrup; Take 5 mLs by mouth every 8 (eight) hours as needed for cough. -     azithromycin (ZITHROMAX) 250 MG tablet; Take two tablets on day 1, then one daily x 4 days    No red flags on exam.  Flu  test negative. He is to be traveling internationally in a few weeks and is wanting to get well soon. Will initiate Azithromycin and Hycodan per orders. Received depo-medrol injection in the office and tolerated well. Discussed taking medications as prescribed. Reviewed return precautions including worsening fever, SOB, worsening cough or other concerns. Push fluids and rest. I recommend that patient follow-up if symptoms worsen or persist despite treatment x 7-10 days, sooner if needed.  . Reviewed expectations re: course of current medical issues. . Discussed self-management of symptoms. . Outlined signs and symptoms indicating need for more acute intervention. . Patient verbalized understanding and all questions were answered. . See orders for this visit as documented in the electronic medical record. . Patient received an After-Visit Summary.  CMA or LPN served as scribe during this visit. History, Physical, and Plan performed by medical provider. The above documentation has been reviewed and is accurate and complete.  Jarold MottoSamantha Raquon Milledge, PA-C

## 2018-08-29 ENCOUNTER — Encounter: Payer: Self-pay | Admitting: Physician Assistant

## 2018-08-29 ENCOUNTER — Ambulatory Visit (INDEPENDENT_AMBULATORY_CARE_PROVIDER_SITE_OTHER): Payer: BLUE CROSS/BLUE SHIELD | Admitting: Physician Assistant

## 2018-08-29 VITALS — BP 150/100 | HR 85 | Temp 98.6°F | Ht 66.0 in | Wt 183.5 lb

## 2018-08-29 DIAGNOSIS — R059 Cough, unspecified: Secondary | ICD-10-CM

## 2018-08-29 DIAGNOSIS — R05 Cough: Secondary | ICD-10-CM | POA: Diagnosis not present

## 2018-08-29 MED ORDER — BUDESONIDE-FORMOTEROL FUMARATE 160-4.5 MCG/ACT IN AERO: 2.0000 | INHALATION_SPRAY | Freq: Two times a day (BID) | RESPIRATORY_TRACT | 3 refills | Status: DC

## 2018-08-29 MED ORDER — AMOXICILLIN-POT CLAVULANATE 875-125 MG PO TABS: 1.0000 | ORAL_TABLET | Freq: Two times a day (BID) | ORAL | 0 refills | Status: DC

## 2018-08-29 MED ORDER — ALBUTEROL SULFATE HFA 108 (90 BASE) MCG/ACT IN AERS: 2.0000 | INHALATION_SPRAY | Freq: Four times a day (QID) | RESPIRATORY_TRACT | 0 refills | Status: AC | PRN

## 2018-08-29 NOTE — Progress Notes (Signed)
Thomas [name] is a 38 y.o. male here for a new problem.   History of Present Illness:   Chief Complaint  Patient presents with  . Cough    x2-3 days,  white flem, loose not thick   He is here with in-person interpreter.  HPI  Reports new cough over the past two-three days. He thinks that he and his wife are passing a virus back and forth. He has not had fever, SOB, decreased appetite. He has pain in R ear and L, but R is worst. He feels like he is wheezing.   He saw me last month for URI but states that his cough improved with the z-pack.  He is traveling soon to [place] and has anxiety about getting worse.   Past Medical History:  Diagnosis Date  . Chronic back pain   . Deaf      Social History   Socioeconomic History  . Marital status: Single    Spouse name: Not on file  . Number of children: Not on file  . Years of education: Not on file  . Highest education level: Not on file  Occupational History  . Not on file  Social Needs  . Financial resource strain: Not on file  . Food insecurity:    Worry: Not on file    Inability: Not on file  . Transportation needs:    Medical: Not on file    Non-medical: Not on file  Tobacco Use  . Smoking status: Never Smoker  . Smokeless tobacco: Never Used  Substance and Sexual Activity  . Alcohol use: No  . Drug use: No  . Sexual activity: Yes    Partners: Female    Birth control/protection: None  Lifestyle  . Physical activity:    Days per week: Not on file    Minutes per session: Not on file  . Stress: Not on file  Relationships  . Social connections:    Talks on phone: Not on file    Gets together: Not on file    Attends religious service: Not on file    Active member of club or organization: Not on file    Attends meetings of clubs or organizations: Not on file    Relationship status: Not on file  . Intimate partner violence:    Fear of current or ex partner: Not on file    Emotionally  abused: Not on file    Physically abused: Not on file    Forced sexual activity: Not on file  Other Topics Concern  . Not on file  Social History Narrative  . Not on file    History reviewed. No pertinent surgical history.  Family History  Problem Relation Age of Onset  . Mental illness Mother   . Cancer Maternal Grandfather   . Diabetes Paternal Grandmother   . Hypertension Paternal Grandmother   . Stroke Paternal Grandmother     No Known Allergies  Current Medications:   Current Outpatient Medications:  .  amLODipine (NORVASC) 5 MG tablet, Take 1 tablet (5 mg total) by mouth daily., Disp: 90 tablet, Rfl: 1 .  azithromycin (ZITHROMAX) 250 MG tablet, Take two tablets on day 1, then one daily x 4 days, Disp: 6 tablet, Rfl: 0 .  dextromethorphan (DELSYM) 30 MG/5ML liquid, Take by mouth as needed for cough., Disp: , Rfl:  .  simvastatin (ZOCOR) 20 MG tablet, Take 1 tablet (20 mg total) by mouth every evening., Disp: 90 tablet,  Rfl: 3 .  traMADol (ULTRAM) 50 MG tablet, Take 1 tablet (50 mg total) by mouth every 8 (eight) hours as needed. FOR CHRONIC PAIN, Disp: 30 tablet, Rfl: 0 .  albuterol (PROVENTIL HFA;VENTOLIN HFA) 108 (90 Base) MCG/ACT inhaler, Inhale 2 puffs into the lungs every 6 (six) hours as needed for wheezing or shortness of breath., Disp: 1 Inhaler, Rfl: 0 .  amoxicillin-clavulanate (AUGMENTIN) 875-125 MG tablet, Take 1 tablet by mouth 2 (two) times daily., Disp: 20 tablet, Rfl: 0 .  budesonide-formoterol (SYMBICORT) 160-4.5 MCG/ACT inhaler, Inhale 2 puffs into the lungs 2 (two) times daily., Disp: 1 Inhaler, Rfl: 3   Review of Systems:   Review of Systems  Constitutional: Negative for chills, fever, malaise/fatigue and weight loss.  Respiratory: Negative for shortness of breath.   Cardiovascular: Negative for chest pain, orthopnea, claudication and leg swelling.  Gastrointestinal: Negative for heartburn, nausea and vomiting.  Neurological: Negative for dizziness,  tingling and headaches.      Vitals:   Vitals:   08/29/18 0948  BP: (!) 150/100  Pulse: 85  Temp: 98.6 F (37 C)  TempSrc: Oral  SpO2: 97%  Weight: 183 lb 8 oz (83.2 kg)  Height: 5\' 6"  (1.676 m)     Body mass index is 29.62 kg/m.  Physical Exam:   Physical Exam Vitals signs and nursing note reviewed.  Constitutional:      General: He is not in acute distress.    Appearance: He is well-developed. He is not ill-appearing or toxic-appearing.  HENT:     Head: Normocephalic and atraumatic.     Right Ear: Ear canal and external ear normal. Tympanic membrane is erythematous. Tympanic membrane is not retracted or bulging.     Left Ear: Tympanic membrane, ear canal and external ear normal. Tympanic membrane is not erythematous, retracted or bulging.     Nose: Mucosal edema, congestion and rhinorrhea present.     Right Sinus: No maxillary sinus tenderness or frontal sinus tenderness.     Left Sinus: No maxillary sinus tenderness or frontal sinus tenderness.     Mouth/Throat:     Lips: Pink.     Mouth: Mucous membranes are moist.     Pharynx: Uvula midline. Posterior oropharyngeal erythema present.     Tonsils: Swelling: 1+ on the right. 1+ on the left.  Eyes:     General: Lids are normal.     Conjunctiva/sclera: Conjunctivae normal.  Neck:     Trachea: Trachea normal.  Cardiovascular:     Rate and Rhythm: Normal rate and regular rhythm.     Heart sounds: Normal heart sounds, S1 normal and S2 normal.  Pulmonary:     Effort: Pulmonary effort is normal.     Breath sounds: Examination of the right-upper field reveals wheezing. Examination of the right-lower field reveals wheezing. Wheezing present. No decreased breath sounds, rhonchi or rales.  Lymphadenopathy:     Cervical: No cervical adenopathy.  Skin:    General: Skin is warm and dry.  Neurological:     Mental Status: He is alert.  Psychiatric:        Speech: Speech normal.        Behavior: Behavior normal. Behavior is  cooperative.      Assessment and Plan:   Cledis was seen today for cough.  Diagnoses and all orders for this visit:  Cough  Other orders -     amoxicillin-clavulanate (AUGMENTIN) 875-125 MG tablet; Take 1 tablet by mouth 2 (two) times daily. -  budesonide-formoterol (SYMBICORT) 160-4.5 MCG/ACT inhaler; Inhale 2 puffs into the lungs 2 (two) times daily. -     albuterol (PROVENTIL HFA;VENTOLIN HFA) 108 (90 Base) MCG/ACT inhaler; Inhale 2 puffs into the lungs every 6 (six) hours as needed for wheezing or shortness of breath.   No red flags on exam. Early R AOM and wheezing. Start augmentin, symbicort and albuterol.  BP recheck was 160/110 -- he endorses anxiety, I will not do oral prednisone at this time. Discussed taking medications as prescribed. Reviewed return precautions including worsening fever, SOB, worsening cough or other concerns. Push fluids and rest. I recommend that patient follow-up if symptoms worsen or persist despite treatment x 7-10 days, sooner if needed.   . Reviewed expectations re: course of current medical issues. . Discussed self-management of symptoms. . Outlined signs and symptoms indicating need for more acute intervention. . Patient verbalized understanding and all questions were answered. . See orders for this visit as documented in the electronic medical record. . Patient received an After-Visit Summary.    [name], PA-C

## 2018-09-19 ENCOUNTER — Encounter: Payer: BLUE CROSS/BLUE SHIELD | Admitting: Family Medicine

## 2018-09-23 ENCOUNTER — Encounter: Payer: Self-pay | Admitting: Physician Assistant

## 2018-09-23 ENCOUNTER — Encounter: Payer: Self-pay | Admitting: Family Medicine

## 2018-09-29 ENCOUNTER — Encounter: Payer: BLUE CROSS/BLUE SHIELD | Admitting: Family Medicine

## 2018-11-20 ENCOUNTER — Other Ambulatory Visit: Payer: Self-pay | Admitting: Physician Assistant

## 2019-01-14 NOTE — Progress Notes (Signed)
Thomas [name] Thomas [name] is a 38 y.o. male is here for follow up.  History of Present Illness:   Thomas [name], CMA acting as scribe for Dr. Briscoe Deutscher.   HPI: Patient in office for follow up. He was out of the country to visit and had to stay longer due to covid. He has ran out of all medications other than inhalers and will need refill on all.  He has been back for the past month and back to work for the past 2 weeks.  He is wearing a mask at all times.  Social distancing at work.  Denies any symptoms of cough, congestion, fever, chills.  Blood pressure today is marginally elevated.  He has been out of his medication.  When he returned home after 4 months, he did notice some issues with allergies that he thinks is due to his cat.  This is improving.  Needing his inhaler less here than in [place].  Continues to have chronic intermittent back pain.  Needs refills on all medications today.  Overall, doing well.  There are no preventive care reminders to display for this patient. Depression screen [facility] 2/9 01/16/2019 07/18/2018 07/18/2017  Decreased Interest 0 0 0  Down, Depressed, Hopeless 0 0 0  PHQ - 2 Score 0 0 0  Altered sleeping 0 0 -  Tired, decreased energy 0 0 -  Change in appetite 0 0 -  Feeling bad or failure about yourself  0 0 -  Trouble concentrating 0 0 -  Moving slowly or fidgety/restless 0 0 -  Suicidal thoughts 0 - -  PHQ-9 Score 0 0 -  Difficult doing work/chores Not difficult at all Not difficult at all -   PMHx, SurgHx, SocialHx, FamHx, Medications, and Allergies were reviewed in the Visit Navigator and updated as appropriate.   Patient Active Problem List   Diagnosis Date Noted  . Essential hypertension 07/19/2018  . Pure hypercholesterolemia 07/18/2018  . Deaf 02/24/2016  . Back pain 02/24/2016   Social History   Tobacco Use  . Smoking status: Never Smoker  . Smokeless tobacco: Never Used  Substance Use Topics  . Alcohol use: No  . Drug use:  No   Current Medications and Allergies   Current Outpatient Medications:  .  albuterol (PROVENTIL HFA;VENTOLIN HFA) 108 (90 Base) MCG/ACT inhaler, Inhale 2 puffs into the lungs every 6 (six) hours as needed for wheezing or shortness of breath., Disp: 1 Inhaler, Rfl: 0 .  amLODipine (NORVASC) 5 MG tablet, Take 1 tablet (5 mg total) by mouth daily., Disp: 90 tablet, Rfl: 1 .  simvastatin (ZOCOR) 20 MG tablet, Take 1 tablet (20 mg total) by mouth every evening., Disp: 90 tablet, Rfl: 3 .  SYMBICORT 160-4.5 MCG/ACT inhaler, TAKE 2 PUFFS BY MOUTH TWICE A DAY, Disp: 30.6 Inhaler, Rfl: 1 .  traMADol (ULTRAM) 50 MG tablet, Take 1 tablet (50 mg total) by mouth every 8 (eight) hours as needed. FOR CHRONIC PAIN, Disp: 30 tablet, Rfl: 0  No Known Allergies Review of Systems   Pertinent items are noted in the HPI. Otherwise, a complete ROS is negative.  Vitals   Vitals:   01/16/19 0934  BP: (!) 150/82  Pulse: 88  Temp: 99 F (37.2 C)  TempSrc: Oral  SpO2: 97%  Weight: 184 lb (83.5 kg)  Height: 5\' 6"  (1.676 m)     Body mass index is 29.7 kg/m.  Physical Exam   Physical Exam Vitals signs and nursing note  reviewed.  Constitutional:      General: He is not in acute distress.    Appearance: He is well-developed.  HENT:     Head: Normocephalic and atraumatic.     Right Ear: External ear normal.     Left Ear: External ear normal.     Nose: Nose normal.  Eyes:     Conjunctiva/sclera: Conjunctivae normal.     Pupils: Pupils are equal, round, and reactive to light.  Neck:     Musculoskeletal: Normal range of motion and neck supple.  Cardiovascular:     Rate and Rhythm: Normal rate and regular rhythm.     Heart sounds: Normal heart sounds.  Pulmonary:     Effort: Pulmonary effort is normal.     Breath sounds: Normal breath sounds.  Abdominal:     General: Bowel sounds are normal.     Palpations: Abdomen is soft.  Musculoskeletal: Normal range of motion.  Skin:    General: Skin is  warm and dry.  Neurological:     Mental Status: He is alert and oriented to person, place, and time.  Psychiatric:        Behavior: Behavior normal.        Thought Content: Thought content normal.        Judgment: Judgment normal.    Assessment and Plan   Thomas KingfisherZakariya was seen today for follow-up.  Diagnoses and all orders for this visit:  Pure hypercholesterolemia -     Comprehensive metabolic panel -     Lipid panel -     simvastatin (ZOCOR) 20 MG tablet; Take 1 tablet (20 mg total) by mouth every evening.  Essential hypertension -     amLODipine (NORVASC) 5 MG tablet; Take 1 tablet (5 mg total) by mouth daily.  Obesity (BMI 30.0-34.9)  Chronic midline thoracic back pain Comments: Uses tramadol 1-2 per month. Uses NSAIDs 1-2 per week. Orders: -     traMADol (ULTRAM) 50 MG tablet; Take 1 tablet (50 mg total) by mouth every 8 (eight) hours as needed. FOR CHRONIC PAIN   . Orders and follow up as documented in EpicCare, reviewed diet, exercise and weight control, cardiovascular risk and specific lipid/LDL goals reviewed, reviewed medications and side effects in detail.  . Reviewed expectations re: course of current medical issues. . Outlined signs and symptoms indicating need for more acute intervention. . Patient verbalized understanding and all questions were answered. . Patient received an After Visit Summary.  CMA served as Neurosurgeonscribe during this visit. History, Physical, and Plan performed by medical provider. The above documentation has been reviewed and is accurate and complete. Thomas [name], D.O.  Thomas RimaErica Tahlor Berenguer, DO [place], [facility] 01/16/2019

## 2019-01-16 ENCOUNTER — Encounter: Payer: Self-pay | Admitting: Family Medicine

## 2019-01-16 ENCOUNTER — Ambulatory Visit (INDEPENDENT_AMBULATORY_CARE_PROVIDER_SITE_OTHER): Payer: BC Managed Care – PPO | Admitting: Family Medicine

## 2019-01-16 ENCOUNTER — Other Ambulatory Visit: Payer: Self-pay

## 2019-01-16 VITALS — BP 150/82 | HR 88 | Temp 99.0°F | Ht 66.0 in | Wt 184.0 lb

## 2019-01-16 DIAGNOSIS — I10 Essential (primary) hypertension: Secondary | ICD-10-CM

## 2019-01-16 DIAGNOSIS — E669 Obesity, unspecified: Secondary | ICD-10-CM | POA: Diagnosis not present

## 2019-01-16 DIAGNOSIS — G8929 Other chronic pain: Secondary | ICD-10-CM

## 2019-01-16 DIAGNOSIS — E7800 Pure hypercholesterolemia, unspecified: Secondary | ICD-10-CM | POA: Diagnosis not present

## 2019-01-16 DIAGNOSIS — M546 Pain in thoracic spine: Secondary | ICD-10-CM | POA: Diagnosis not present

## 2019-01-16 DIAGNOSIS — R748 Abnormal levels of other serum enzymes: Secondary | ICD-10-CM

## 2019-01-16 LAB — LIPID PANEL
Cholesterol: 172 mg/dL (ref 0–200)
HDL: 43 mg/dL (ref >39.00)
LDL Cholesterol: 95 mg/dL (ref 0–99)
NonHDL: 129.08
Total CHOL/HDL Ratio: 4
Triglycerides: 168 mg/dL — ABNORMAL HIGH (ref 0.0–149.0)
VLDL: 33.6 mg/dL (ref 0.0–40.0)

## 2019-01-16 LAB — COMPREHENSIVE METABOLIC PANEL
ALT: 44 U/L (ref 0–53)
AST: 20 U/L (ref 0–37)
Albumin: 4.7 g/dL (ref 3.5–5.2)
Alkaline Phosphatase: 141 U/L — ABNORMAL HIGH (ref 39–117)
BUN: 14 mg/dL (ref 6–23)
CO2: 29 mEq/L (ref 19–32)
Calcium: 9.4 mg/dL (ref 8.4–10.5)
Chloride: 102 mEq/L (ref 96–112)
Creatinine, Ser: 0.9 mg/dL (ref 0.40–1.50)
GFR: 94.4 mL/min (ref >60.00)
Glucose, Bld: 92 mg/dL (ref 70–99)
Potassium: 3.9 mEq/L (ref 3.5–5.1)
Sodium: 139 mEq/L (ref 135–145)
Total Bilirubin: 0.6 mg/dL (ref 0.2–1.2)
Total Protein: 7.1 g/dL (ref 6.0–8.3)

## 2019-01-16 MED ORDER — SIMVASTATIN 20 MG PO TABS: 20.0000 mg | ORAL_TABLET | Freq: Every evening | ORAL | 3 refills | Status: DC

## 2019-01-16 MED ORDER — AMLODIPINE BESYLATE 5 MG PO TABS: 5.0000 mg | ORAL_TABLET | Freq: Every day | ORAL | 1 refills | Status: DC

## 2019-01-16 MED ORDER — TRAMADOL HCL 50 MG PO TABS: 50.0000 mg | ORAL_TABLET | Freq: Three times a day (TID) | ORAL | 0 refills | Status: DC | PRN

## 2019-01-19 ENCOUNTER — Encounter: Payer: Self-pay | Admitting: Family Medicine

## 2019-01-19 DIAGNOSIS — R748 Abnormal levels of other serum enzymes: Secondary | ICD-10-CM | POA: Insufficient documentation

## 2019-01-19 NOTE — Addendum Note (Signed)
Addended by: Briscoe Deutscher R on: 01/19/2019 08:00 AM   Modules accepted: Orders

## 2019-01-20 ENCOUNTER — Encounter: Payer: Self-pay | Admitting: Family Medicine

## 2019-01-21 ENCOUNTER — Encounter: Payer: Self-pay | Admitting: Family Medicine

## 2019-01-22 ENCOUNTER — Other Ambulatory Visit: Payer: Self-pay

## 2019-01-22 ENCOUNTER — Other Ambulatory Visit (INDEPENDENT_AMBULATORY_CARE_PROVIDER_SITE_OTHER): Payer: BC Managed Care – PPO

## 2019-01-22 DIAGNOSIS — R748 Abnormal levels of other serum enzymes: Secondary | ICD-10-CM | POA: Diagnosis not present

## 2019-01-22 LAB — GAMMA GT: GGT: 23 U/L (ref 7–51)

## 2019-01-27 ENCOUNTER — Encounter: Payer: Self-pay | Admitting: Family Medicine

## 2019-01-27 ENCOUNTER — Telehealth: Payer: Self-pay | Admitting: *Deleted

## 2019-01-27 NOTE — Addendum Note (Signed)
Addended by: Briscoe Deutscher R on: 01/27/2019 07:03 AM   Modules accepted: Orders

## 2019-01-27 NOTE — Addendum Note (Signed)
Addended by: Francella Solian on: 01/27/2019 08:48 AM   Modules accepted: Orders

## 2019-01-27 NOTE — Telephone Encounter (Signed)
Referral started  

## 2019-01-27 NOTE — Telephone Encounter (Signed)
Copied from [place] [id number]. Topic: General - Other >> Jan 27, 2019 10:43 AM Yvette Rack wrote: Reason for CRM: Sign language agent informed that pt stated he received a message asking if he would like a referral to endocrinology. Pt would like referral.

## 2019-01-30 ENCOUNTER — Encounter: Payer: Self-pay | Admitting: Family Medicine

## 2019-02-11 ENCOUNTER — Ambulatory Visit
Admission: RE | Admit: 2019-02-11 | Discharge: 2019-02-11 | Disposition: A | Discharge status: Home / Self Care | Payer: BC Managed Care – PPO | Source: Ambulatory Visit | Attending: Family Medicine | Admitting: Family Medicine

## 2019-02-11 DIAGNOSIS — R748 Abnormal levels of other serum enzymes: Secondary | ICD-10-CM

## 2019-02-13 ENCOUNTER — Telehealth: Payer: Self-pay

## 2019-02-13 ENCOUNTER — Encounter: Payer: Self-pay | Admitting: Family Medicine

## 2019-02-13 NOTE — Telephone Encounter (Signed)
Copied from [place] [id number]. Topic: General - Call Back - No Documentation >> Feb 13, 2019  1:15 PM Erick Blinks wrote: Reason for CRM: Pt's wife [name] called requesting call back to discuss recent ultrasounds. Please advise  Best contact: [phone]

## 2019-02-13 NOTE — Telephone Encounter (Signed)
Please advise wanted results on [place] please call [phone] when we get results.

## 2019-02-15 NOTE — Telephone Encounter (Signed)
See result note.  

## 2019-02-18 ENCOUNTER — Other Ambulatory Visit: Payer: Self-pay

## 2019-02-18 DIAGNOSIS — K8063 Calculus of gallbladder and bile duct with acute cholecystitis with obstruction: Secondary | ICD-10-CM

## 2019-02-18 NOTE — Telephone Encounter (Signed)
Called spoke to patient and wife

## 2019-02-20 ENCOUNTER — Other Ambulatory Visit: Payer: Self-pay

## 2019-02-20 DIAGNOSIS — K8063 Calculus of gallbladder and bile duct with acute cholecystitis with obstruction: Secondary | ICD-10-CM

## 2019-02-20 NOTE — Telephone Encounter (Signed)
referral placed and information added to chart

## 2019-03-22 ENCOUNTER — Other Ambulatory Visit: Payer: Self-pay | Admitting: Family Medicine

## 2019-03-22 DIAGNOSIS — G8929 Other chronic pain: Secondary | ICD-10-CM

## 2019-03-22 NOTE — Telephone Encounter (Signed)
   Last app 01/16/2019 Last script 01/16/2019 #30 no rf

## 2019-03-23 MED ORDER — TRAMADOL HCL 50 MG PO TABS: 50.0000 mg | ORAL_TABLET | Freq: Three times a day (TID) | ORAL | 4 refills | Status: DC | PRN

## 2019-04-04 ENCOUNTER — Encounter: Payer: Self-pay | Admitting: Family Medicine

## 2019-04-19 NOTE — Progress Notes (Signed)
Thomas [name] Thomas [name] is a 38 y.o. male is here for follow up.  History of Present Illness:   HPI: Patient presents with his interpreter.  He has been eating well, avoiding red meat and fried foods over the past 3 months.  He has lost 10 to 15 pounds doing this.  Feels very well.  Blood pressure is at goal.  After our last visit, he was found to have an elevated alkaline phosphatase and cholelithiasis with concern for CBD stone.  He did see his surgeon and was told that he is stable and does not need surgery at this time.  He will be visiting family on Thanksgiving in [place].  He asks about Covid testing prior to visiting them.  Review of Systems  Constitutional: Negative for chills, fever, malaise/fatigue and weight loss.  Respiratory: Negative for cough, shortness of breath and wheezing.   Cardiovascular: Negative for chest pain, palpitations and leg swelling.  Gastrointestinal: Negative for abdominal pain, constipation, diarrhea, nausea and vomiting.  Genitourinary: Negative for dysuria and urgency.  Musculoskeletal: Negative for joint pain and myalgias.  Skin: Negative for rash.  Neurological: Negative for dizziness and headaches.  Psychiatric/Behavioral: Negative for depression, substance abuse and suicidal ideas. The patient is not nervous/anxious.    There are no preventive care reminders to display for this patient.   Depression screen [facility] 2/9 01/16/2019 07/18/2018 07/18/2017  Decreased Interest 0 0 0  Down, Depressed, Hopeless 0 0 0  PHQ - 2 Score 0 0 0  Altered sleeping 0 0 -  Tired, decreased energy 0 0 -  Change in appetite 0 0 -  Feeling bad or failure about yourself  0 0 -  Trouble concentrating 0 0 -  Moving slowly or fidgety/restless 0 0 -  Suicidal thoughts 0 - -  PHQ-9 Score 0 0 -  Difficult doing work/chores Not difficult at all Not difficult at all -   PMHx, SurgHx, SocialHx, FamHx, Medications, and Allergies were reviewed in the Visit Navigator and  updated as appropriate.   Patient Active Problem List   Diagnosis Date Noted  . Alkaline phosphatase elevation 01/19/2019  . Essential hypertension 07/19/2018  . Pure hypercholesterolemia 07/18/2018  . Deaf 02/24/2016  . Back pain 02/24/2016   Social History   Tobacco Use  . Smoking status: Never Smoker  . Smokeless tobacco: Never Used  Substance Use Topics  . Alcohol use: No  . Drug use: No   Current Medications and Allergies   .  albuterol (PROVENTIL HFA;VENTOLIN HFA) 108 (90 Base) MCG/ACT inhaler, Inhale 2 puffs into the lungs every 6 (six) hours as needed for wheezing or shortness of breath., Disp: 1 Inhaler, Rfl: 0 .  amLODipine (NORVASC) 5 MG tablet, Take 1 tablet (5 mg total) by mouth daily., Disp: 90 tablet, Rfl: 1 .  simvastatin (ZOCOR) 20 MG tablet, Take 1 tablet (20 mg total) by mouth every evening., Disp: 90 tablet, Rfl: 3 .  SYMBICORT 160-4.5 MCG/ACT inhaler, TAKE 2 PUFFS BY MOUTH TWICE A DAY, Disp: 30.6 Inhaler, Rfl: 1 .  traMADol (ULTRAM) 50 MG tablet, Take 1 tablet (50 mg total) by mouth every 8 (eight) hours as needed. FOR CHRONIC PAIN, Disp: 30 tablet, Rfl: 4  No Known Allergies   Review of Systems   Pertinent items are noted in the HPI. Otherwise, a complete ROS is negative.  Vitals   Vitals:   04/20/19 0906  BP: 124/78  Pulse: 95  Temp: 98.5 F (36.9 C)  SpO2: 97%  Weight: 176 lb (79.8 kg)  Height: 5\' 6"  (1.676 m)     Body mass index is 28.41 kg/m.  Physical Exam   Physical Exam Constitutional:      General: He is not in acute distress.    Appearance: He is well-developed. He is not diaphoretic.  HENT:     Head: Normocephalic and atraumatic.     Right Ear: External ear normal.     Left Ear: External ear normal.     Nose: Nose normal.  Eyes:     Conjunctiva/sclera: Conjunctivae normal.  Neck:     Musculoskeletal: Normal range of motion.  Cardiovascular:     Rate and Rhythm: Normal rate and regular rhythm.  Pulmonary:     Effort:  Pulmonary effort is normal. No respiratory distress.  Abdominal:     General: Bowel sounds are normal.     Palpations: Abdomen is soft.     Tenderness: There is no abdominal tenderness.  Musculoskeletal: Normal range of motion.        General: No deformity.  Skin:    General: Skin is warm.  Neurological:     Mental Status: He is alert and oriented to person, place, and time.  Psychiatric:        Behavior: Behavior normal.    Assessment and Plan   Thomas [name] was seen today for follow-up.  Diagnoses and all orders for this visit:  Pure hypercholesterolemia -     simvastatin (ZOCOR) 20 MG tablet; Take 1 tablet (20 mg total) by mouth every evening.  Essential hypertension -     amLODipine (NORVASC) 5 MG tablet; Take 1 tablet (5 mg total) by mouth daily.  Chronic midline thoracic back pain Comments: Uses tramadol 1-2 per month. Uses NSAIDs 1-2 per week. Orders: -     traMADol (ULTRAM) 50 MG tablet; Take 1 tablet (50 mg total) by mouth every 8 (eight) hours as needed. FOR CHRONIC PAIN   . Orders and follow up as documented in EpicCare, reviewed diet, exercise and weight control, cardiovascular risk and specific lipid/LDL goals reviewed, reviewed medications and side effects in detail.  . Reviewed expectations re: course of current medical issues. . Outlined signs and symptoms indicating need for more acute intervention. . Patient verbalized understanding and all questions were answered. . Patient received an After Visit Summary.  Thomas Kingfisher, DO [place] [place] 04/20/2019

## 2019-04-20 ENCOUNTER — Other Ambulatory Visit: Payer: Self-pay

## 2019-04-20 ENCOUNTER — Encounter: Payer: Self-pay | Admitting: Family Medicine

## 2019-04-20 ENCOUNTER — Ambulatory Visit (INDEPENDENT_AMBULATORY_CARE_PROVIDER_SITE_OTHER): Payer: BC Managed Care – PPO | Admitting: Family Medicine

## 2019-04-20 VITALS — BP 124/78 | HR 95 | Temp 98.5°F | Ht 66.0 in | Wt 176.0 lb

## 2019-04-20 DIAGNOSIS — M546 Pain in thoracic spine: Secondary | ICD-10-CM

## 2019-04-20 DIAGNOSIS — I10 Essential (primary) hypertension: Secondary | ICD-10-CM | POA: Diagnosis not present

## 2019-04-20 DIAGNOSIS — E7800 Pure hypercholesterolemia, unspecified: Secondary | ICD-10-CM | POA: Diagnosis not present

## 2019-04-20 DIAGNOSIS — Z23 Encounter for immunization: Secondary | ICD-10-CM | POA: Diagnosis not present

## 2019-04-20 DIAGNOSIS — G8929 Other chronic pain: Secondary | ICD-10-CM

## 2019-04-20 MED ORDER — TRAMADOL HCL 50 MG PO TABS: 50.0000 mg | ORAL_TABLET | Freq: Three times a day (TID) | ORAL | 4 refills | Status: DC | PRN

## 2019-04-20 MED ORDER — AMLODIPINE BESYLATE 5 MG PO TABS: 5.0000 mg | ORAL_TABLET | Freq: Every day | ORAL | 1 refills | Status: DC

## 2019-04-20 MED ORDER — SIMVASTATIN 20 MG PO TABS: 20.0000 mg | ORAL_TABLET | Freq: Every evening | ORAL | 3 refills | Status: DC

## 2019-07-09 ENCOUNTER — Encounter: Payer: Self-pay | Admitting: Physician Assistant

## 2019-07-09 NOTE — Telephone Encounter (Signed)
Please contact pt and schedule a telephone appt for cough.  Pt is deaf, please see message on how to contact pt. Thanks

## 2019-07-09 NOTE — Telephone Encounter (Signed)
LVM FOR PATIENT TO CALL BACK AND SCHEDULE A VV  

## 2019-07-09 NOTE — Telephone Encounter (Signed)
Pt called and scheduled VV for tomorrow 1/8 at 11:40. Attached phone number to reach him at with interpreter on appt notes.

## 2019-07-10 ENCOUNTER — Ambulatory Visit: Payer: BC Managed Care – PPO | Attending: Internal Medicine

## 2019-07-10 ENCOUNTER — Ambulatory Visit (INDEPENDENT_AMBULATORY_CARE_PROVIDER_SITE_OTHER): Payer: BC Managed Care – PPO | Admitting: Physician Assistant

## 2019-07-10 ENCOUNTER — Encounter: Payer: Self-pay | Admitting: Physician Assistant

## 2019-07-10 VITALS — Ht 66.0 in | Wt 180.0 lb

## 2019-07-10 DIAGNOSIS — Z20822 Contact with and (suspected) exposure to covid-19: Secondary | ICD-10-CM

## 2019-07-10 DIAGNOSIS — R05 Cough: Secondary | ICD-10-CM

## 2019-07-10 DIAGNOSIS — R059 Cough, unspecified: Secondary | ICD-10-CM

## 2019-07-10 MED ORDER — AZITHROMYCIN 250 MG PO TABS: ORAL_TABLET | ORAL | 0 refills | Status: DC

## 2019-07-10 NOTE — Progress Notes (Signed)
Virtual Visit via Video   I connected with Thomas [name] on 07/10/19 at 11:40 AM EST by a video enabled telemedicine application and verified that I am speaking with the correct person using two identifiers. Location patient: Home Location provider: [facility], Office Persons participating in the virtual visit: Thomas [name], and Thomas [name], [place] PA-C, Thomas Pickler, LPN   I discussed the limitations of evaluation and management by telemedicine and the availability of in person appointments. The patient expressed understanding and agreed to proceed.  I acted as a Education administrator for [employer], PA-C [employer], LPN  Subjective:   HPI:   Patient is requesting evaluation for possible COVID-19.  Symptom onset: Tuesday  Travel/contacts: No travel or exposure  Patient endorses the following symptoms: sinus congestion, dry cough (non-productive) and chest tightness  Patient denies the following symptoms: Fever (none), sinus headache, rhinorrhea, sore throat and myalgia; SOB  Treatments tried: Delsym cough syrup  History of bronchitis. This feels like the same. Pushing fluids and getting rest.  Patient risk factors: Current ZOXWR-60 risk of complications score: 3 Smoking status: Thomas [name]  reports that he has never smoked. He has never used smokeless tobacco.   ROS: See pertinent positives and negatives per HPI.  Patient Active Problem List   Diagnosis Date Noted  . Alkaline phosphatase elevation 01/19/2019  . Essential hypertension 07/19/2018  . Pure hypercholesterolemia 07/18/2018  . Deaf 02/24/2016  . Back pain 02/24/2016    Social History   Tobacco Use  . Smoking status: Never Smoker  . Smokeless tobacco: Never Used  Substance Use Topics  . Alcohol use: No    Current Outpatient Medications:  .  albuterol (PROVENTIL HFA;VENTOLIN HFA) 108 (90 Base) MCG/ACT inhaler, Inhale 2 puffs into the lungs  every 6 (six) hours as needed for wheezing or shortness of breath., Disp: 1 Inhaler, Rfl: 0 .  amLODipine (NORVASC) 5 MG tablet, Take 1 tablet (5 mg total) by mouth daily., Disp: 90 tablet, Rfl: 1 .  azithromycin (ZITHROMAX) 250 MG tablet, Take two tablets on day 1, then one daily x 4 days, Disp: 6 tablet, Rfl: 0 .  simvastatin (ZOCOR) 20 MG tablet, Take 1 tablet (20 mg total) by mouth every evening., Disp: 90 tablet, Rfl: 3 .  traMADol (ULTRAM) 50 MG tablet, Take 1 tablet (50 mg total) by mouth every 8 (eight) hours as needed. FOR CHRONIC PAIN, Disp: 30 tablet, Rfl: 4  No Known Allergies  Objective:   VITALS: Per patient if applicable, see vitals. GENERAL: Alert, appears well and in no acute distress. HEENT: Atraumatic, conjunctiva clear, no obvious abnormalities on inspection of external nose and ears. NECK: Normal movements of the head and neck. CARDIOPULMONARY: No increased WOB. Speaking in clear sentences. I:E ratio WNL.  MS: Moves all visible extremities without noticeable abnormality. PSYCH: Pleasant and cooperative, well-groomed. Speech normal rate and rhythm. Affect is appropriate. Insight and judgement are appropriate. Attention is focused, linear, and appropriate.  NEURO: CN grossly intact. Oriented as arrived to appointment on time with no prompting. Moves both UE equally.  SKIN: No obvious lesions, wounds, erythema, or cyanosis noted on face or hands.  Assessment and Plan:   Thomas [name] was seen today for covid symptoms.  Diagnoses and all orders for this visit:  Cough -     Novel Coronavirus, NAA ([facility])  Other orders -     azithromycin (ZITHROMAX) 250 MG tablet; Take two tablets on day 1, then one daily  x 4 days   Patient has a respiratory illness without signs of acute distress or respiratory compromise at this time.   Will treat with oral azithromycin to cover for possible bacterial infection.  We are going to send patient for COVID-19 testing. As a precaution,  they have been advised to remain home until COVID-19 results and then possible further quarantine after that based on results and symptoms. Advised if they experience a "second sickening" or worsening symptoms as the illness progresses, they are to call the office for further instructions or seek emergent evaluation for any severe symptoms.   . Reviewed expectations re: course of current medical issues. . Discussed self-management of symptoms. . Outlined signs and symptoms indicating need for more acute intervention. . Patient verbalized understanding and all questions were answered. [facility] Health Maintenance issues including appropriate healthy diet, exercise, and smoking avoidance were discussed with patient. . See orders for this visit as documented in the electronic medical record.  I discussed the assessment and treatment plan with the patient. The patient was provided an opportunity to ask questions and all were answered. The patient agreed with the plan and demonstrated an understanding of the instructions.   The patient was advised to call back or seek an in-person evaluation if the symptoms worsen or if the condition fails to improve as anticipated.   CMA or LPN served as scribe during this visit. History, Physical, and Plan performed by medical provider. The above documentation has been reviewed and is accurate and complete.  [place], [place] 07/10/2019

## 2019-07-12 LAB — NOVEL CORONAVIRUS, NAA: SARS-CoV-2, NAA: NOT DETECTED

## 2019-07-14 ENCOUNTER — Telehealth: Payer: Self-pay

## 2019-07-14 NOTE — Telephone Encounter (Signed)
Pt notified of negative COVID-19 results with a sign language interpreter. Understanding verbalized.  Thomas [name]

## 2019-07-31 ENCOUNTER — Other Ambulatory Visit: Payer: Self-pay

## 2019-08-07 ENCOUNTER — Encounter: Payer: Self-pay | Admitting: Physician Assistant

## 2019-08-24 ENCOUNTER — Encounter: Payer: BC Managed Care – PPO | Admitting: Physician Assistant

## 2019-10-01 ENCOUNTER — Encounter: Payer: Self-pay | Admitting: Family Medicine

## 2019-10-01 ENCOUNTER — Ambulatory Visit (INDEPENDENT_AMBULATORY_CARE_PROVIDER_SITE_OTHER): Payer: BC Managed Care – PPO | Admitting: Family Medicine

## 2019-10-01 ENCOUNTER — Other Ambulatory Visit: Payer: Self-pay

## 2019-10-01 ENCOUNTER — Encounter: Payer: Self-pay | Admitting: Physician Assistant

## 2019-10-01 DIAGNOSIS — R059 Cough, unspecified: Secondary | ICD-10-CM

## 2019-10-01 DIAGNOSIS — R05 Cough: Secondary | ICD-10-CM

## 2019-10-01 MED ORDER — AZITHROMYCIN 250 MG PO TABS: ORAL_TABLET | ORAL | 0 refills | Status: DC

## 2019-10-01 MED ORDER — HYDROCODONE-HOMATROPINE 5-1.5 MG/5ML PO SYRP: 5.0000 mL | ORAL_SOLUTION | Freq: Three times a day (TID) | ORAL | 0 refills | Status: DC | PRN

## 2019-10-01 NOTE — Telephone Encounter (Signed)
Please contact pt and schedule appointment with another provider, Lelon Mast is off.

## 2019-10-01 NOTE — Telephone Encounter (Signed)
LVM for patient to call back and schedule a VV with a different provider since Lelon Mast is not here.

## 2019-10-01 NOTE — Progress Notes (Signed)
   Thomas [name] is a 39 y.o. male who presents today for a telephone visit.  Patient is deaf - interpretation was provided via sign language interpretor service.   Assessment/Plan:  Cough No red flags.  Likely allergic had mild fevers yesterday.  Has not received both doses of Covid vaccine.  Low suspicion for COVID-19 infection or other infection at this point.  We will treat symptomatically with Hycodan as this is worked well for him in the past.  Given the upcoming 1 week end, we will also send in a pocket prescription for azithromycin with instruction not start with symptoms worsen or do not improve in the next several days.  Discussed reasons return to care.    Subjective:  HPI:  Patient started having a cough about 3 days ago. Has tried OTC medications without improvement.  Just has a cough. Cough is productive of sputum. No mucus. Some mild fevers and chills. Has received both covid vaccines.        Objective/Observations   NAD  Telephone Visit   I connected with Thomas [name] on 10/01/19 at  4:00 PM EDT via telephone and verified that I am speaking with the correct person using two identifiers. I discussed the limitations of evaluation and management by telemedicine and the availability of in person appointments. The patient expressed understanding and agreed to proceed.   Patient location: Home Provider location: [place] [employer] Persons participating in the virtual visit: Myself and Patient       Katina Degree. Jimmey Ralph, MD 10/01/2019 8:53 AM

## 2019-11-14 ENCOUNTER — Other Ambulatory Visit: Payer: Self-pay | Admitting: Family Medicine

## 2019-11-14 DIAGNOSIS — G8929 Other chronic pain: Secondary | ICD-10-CM

## 2019-11-16 ENCOUNTER — Other Ambulatory Visit: Payer: Self-pay | Admitting: Physician Assistant

## 2019-11-16 DIAGNOSIS — G8929 Other chronic pain: Secondary | ICD-10-CM

## 2019-11-16 MED ORDER — TRAMADOL HCL 50 MG PO TABS: 50.0000 mg | ORAL_TABLET | Freq: Three times a day (TID) | ORAL | 0 refills | Status: DC | PRN

## 2019-11-16 NOTE — Telephone Encounter (Signed)
Please review

## 2019-11-16 NOTE — Telephone Encounter (Signed)
Patient calling stating she needs refill on her tramadol 50 mg. Please advise

## 2019-11-16 NOTE — Telephone Encounter (Signed)
LAST APPOINTMENT DATE: 10/01/2019  NEXT APPOINTMENT DATE: Visit date not found    LAST REFILL: 04/20/19  QTY: 30 tablet

## 2019-11-27 ENCOUNTER — Encounter: Payer: Self-pay | Admitting: Physician Assistant

## 2020-01-11 ENCOUNTER — Other Ambulatory Visit: Payer: Self-pay

## 2020-01-12 ENCOUNTER — Ambulatory Visit (INDEPENDENT_AMBULATORY_CARE_PROVIDER_SITE_OTHER): Payer: BC Managed Care – PPO | Admitting: Physician Assistant

## 2020-01-12 ENCOUNTER — Encounter: Payer: Self-pay | Admitting: Physician Assistant

## 2020-01-12 VITALS — BP 156/88 | HR 68 | Temp 99.1°F | Ht 66.0 in | Wt 182.0 lb

## 2020-01-12 DIAGNOSIS — L918 Other hypertrophic disorders of the skin: Secondary | ICD-10-CM

## 2020-01-12 NOTE — Patient Instructions (Addendum)
It was great to see you!   Biopsy, Surgery (Curettage) & Surgery (Excision) Aftercare Instructions   1. Okay to remove bandage in 24 hours   2. Wash area with soap and water   3. Apply Vaseline to area twice daily until healed (Not Neosporin)   4. Okay to cover with a Band-Aid to decrease the chance of infection or prevent irritation from clothing; also it's okay to uncover lesion at home.   5. Suture instructions: return to our office in 7-10 or 10-14 days for a nurse visit for suture removal. Variable healing with sutures, if pain or itching occurs call our office. It's okay to shower or bathe 24 hours after sutures are given.   6. The following risks may occur after a biopsy, curettage or excision: bleeding, scarring, discoloration, recurrence, infection (redness, yellow drainage, pain or swelling).

## 2020-01-12 NOTE — Progress Notes (Signed)
Thomas [name] is a 39 y.o. male here for skin tag removal.  I acted as a Neurosurgeon for [employer], PA-C Thomas [name], [place]  History of Present Illness:   Chief Complaint  Patient presents with  . Skin tag removal    HPI  Skin tag removal Pt presents for a skin tag removal. He has two tags that need to be removed. One in his inner left leg and the other on his lower back. The one in his inner thigh area has twisted and turned black. Most of it fell off, but there is still some remaining that needs to be removed. Bothering him since he started his new job. Noticed tags about two years ago which has grown in size.  He has had skin tags removed in the past without any significant issues.  Past Medical History:  Diagnosis Date  . Chronic back pain   . Deaf      Social History   Tobacco Use  . Smoking status: Never Smoker  . Smokeless tobacco: Never Used  Substance Use Topics  . Alcohol use: No  . Drug use: No    History reviewed. No pertinent surgical history.  Family History  Problem Relation Age of Onset  . Mental illness Mother   . Cancer Maternal Grandfather   . Diabetes Paternal Grandmother   . Hypertension Paternal Grandmother   . Stroke Paternal Grandmother     No Known Allergies  Current Medications:   Current Outpatient Medications:  .  albuterol (PROVENTIL HFA;VENTOLIN HFA) 108 (90 Base) MCG/ACT inhaler, Inhale 2 puffs into the lungs every 6 (six) hours as needed for wheezing or shortness of breath., Disp: 1 Inhaler, Rfl: 0 .  amLODipine (NORVASC) 5 MG tablet, Take 1 tablet (5 mg total) by mouth daily., Disp: 90 tablet, Rfl: 1 .  simvastatin (ZOCOR) 20 MG tablet, Take 1 tablet (20 mg total) by mouth every evening., Disp: 90 tablet, Rfl: 3 .  traMADol (ULTRAM) 50 MG tablet, Take 1 tablet (50 mg total) by mouth every 8 (eight) hours as needed. FOR CHRONIC PAIN, Disp: 30 tablet, Rfl: 0   Review of Systems:   ROS  Negative unless otherwise  specified per HPI.  Vitals:   Vitals:   01/12/20 0853  BP: (!) 156/88  Pulse: 68  Temp: 99.1 F (37.3 C)  TempSrc: Temporal  SpO2: 97%  Weight: 182 lb (82.6 kg)  Height: 5\' 6"  (1.676 m)     Body mass index is 29.38 kg/m.  Physical Exam:   Physical Exam Vitals and nursing note reviewed.  Constitutional:      Appearance: He is well-developed.  HENT:     Head: Normocephalic.  Eyes:     Conjunctiva/sclera: Conjunctivae normal.     Pupils: Pupils are equal, round, and reactive to light.  Pulmonary:     Effort: Pulmonary effort is normal.  Musculoskeletal:        General: Normal range of motion.     Cervical back: Normal range of motion.  Skin:    General: Skin is warm and dry.     Comments: One large flesh-colored skin tag in inner L buttock One small flesh-colored skin tag in inner L groin  Neurological:     Mental Status: He is alert and oriented to person, place, and time.  Psychiatric:        Behavior: Behavior normal.        Thought Content: Thought content normal.  Judgment: Judgment normal.    Procedure: Skin tag removal Informed consent:  Discussed risks (permanent scarring, infection, pain, bleeding, bruising, redness, and recurrence of the lesion) and benefits of the procedure, as well as the alternatives.  He is aware that skin tags are benign lesions, and their removal is often not considered medically necessary.  Informed consent was obtained. Anesthesia: lidocaine with epi  The area was prepared and draped in a standard fashion. Snip removal was performed.    Number of lesions removed:  2   Assessment and Plan:   Thomas [name] was seen today for skin tag removal.  Diagnoses and all orders for this visit:  Skin tags, multiple acquired   Antibiotic ointment and a sterile dressing were applied.   The patient tolerated procedure well. The patient was instructed on post-op care.    . Reviewed expectations re: course of current medical  issues. . Discussed self-management of symptoms. . Outlined signs and symptoms indicating need for more acute intervention. . Patient verbalized understanding and all questions were answered. . See orders for this visit as documented in the electronic medical record. . Patient received an After-Visit Summary.  CMA or LPN served as scribe during this visit. History, Physical, and Plan performed by medical provider. The above documentation has been reviewed and is accurate and complete.  Thomas Motto, PA-C

## 2020-02-09 ENCOUNTER — Other Ambulatory Visit: Payer: Self-pay | Admitting: Physician Assistant

## 2020-02-09 DIAGNOSIS — G8929 Other chronic pain: Secondary | ICD-10-CM

## 2020-02-09 NOTE — Telephone Encounter (Signed)
LAST APPOINTMENT DATE: 01/12/2020   NEXT APPOINTMENT DATE: Visit date not found    LAST REFILL: 11/16/2019  QTY: 30 tablets

## 2020-03-01 ENCOUNTER — Encounter: Payer: Self-pay | Admitting: Physician Assistant

## 2020-04-11 ENCOUNTER — Other Ambulatory Visit: Payer: BC Managed Care – PPO

## 2020-04-11 DIAGNOSIS — Z20822 Contact with and (suspected) exposure to covid-19: Secondary | ICD-10-CM

## 2020-04-12 LAB — NOVEL CORONAVIRUS, NAA: SARS-CoV-2, NAA: DETECTED — AB

## 2020-04-12 LAB — SARS-COV-2, NAA 2 DAY TAT

## 2020-04-13 ENCOUNTER — Other Ambulatory Visit: Payer: Self-pay | Admitting: Physician Assistant

## 2020-04-13 ENCOUNTER — Telehealth: Payer: Self-pay | Admitting: Internal Medicine

## 2020-04-13 DIAGNOSIS — I10 Essential (primary) hypertension: Secondary | ICD-10-CM

## 2020-04-13 DIAGNOSIS — U071 COVID-19: Secondary | ICD-10-CM

## 2020-04-13 NOTE — Progress Notes (Signed)
I connected by phone with Thomas [name] on 04/13/2020 at 2:02 PM to discuss the potential use of a new treatment for mild to moderate COVID-19 viral infection in non-hospitalized patients. Discussed with wife.   This patient is a 39 y.o. male that meets the FDA criteria for Emergency Use Authorization of COVID monoclonal antibody casirivimab/imdevimab or bamlanivimab/eteseviamb.  Has a (+) direct SARS-CoV-2 viral test result  Has mild or moderate COVID-19   Is NOT hospitalized due to COVID-19  Is within 10 days of symptom onset  Has at least one of the high risk factor(s) for progression to severe COVID-19 and/or hospitalization as defined in EUA.  Specific high risk criteria : BMI > 25 and Cardiovascular disease or hypertension   I have spoken and communicated the following to the patient or parent/caregiver regarding COVID monoclonal antibody treatment:  1. FDA has authorized the emergency use for the treatment of mild to moderate COVID-19 in adults and pediatric patients with positive results of direct SARS-CoV-2 viral testing who are 82 years of age and older weighing at least 40 kg, and who are at high risk for progressing to severe COVID-19 and/or hospitalization.  2. The significant known and potential risks and benefits of COVID monoclonal antibody, and the extent to which such potential risks and benefits are unknown.  3. Information on available alternative treatments and the risks and benefits of those alternatives, including clinical trials.  4. Patients treated with COVID monoclonal antibody should continue to self-isolate and use infection control measures (e.g., wear mask, isolate, social distance, avoid sharing personal items, clean and disinfect "high touch" surfaces, and frequent handwashing) according to CDC guidelines.   5. The patient or parent/caregiver has the option to accept or refuse COVID monoclonal antibody treatment.  After reviewing this  information with the patient, the patient has agreed to receive one of the available covid 19 monoclonal antibodies and will be provided an appropriate fact sheet prior to infusion.   [place], [place] 04/13/2020 2:02 PM

## 2020-04-13 NOTE — Telephone Encounter (Signed)
Called to Discuss with patient about Covid symptoms and the use of the monoclonal antibody infusion for those with mild to moderate Covid symptoms and at a high risk of hospitalization.     Pt appears to qualify for this infusion due to co-morbid conditions and/or a member of an at-risk group in accordance with the FDA Emergency Use Authorization. Pt with BMI >25 and hx htn. Need to determine onset of sx's   Unable to reach pt. Left VM, text message and MyChart message.   Cyndee Brightly, NP [facility]

## 2020-04-14 ENCOUNTER — Ambulatory Visit (HOSPITAL_COMMUNITY)
Admission: RE | Admit: 2020-04-14 | Discharge: 2020-04-14 | Disposition: A | Discharge status: Home / Self Care | Payer: BC Managed Care – PPO | Source: Ambulatory Visit | Attending: Pulmonary Disease | Admitting: Pulmonary Disease

## 2020-04-14 DIAGNOSIS — U071 COVID-19: Secondary | ICD-10-CM | POA: Diagnosis present

## 2020-04-14 MED ORDER — ACETAMINOPHEN 325 MG PO TABS
650.0000 mg | ORAL_TABLET | Freq: Once | ORAL | Status: AC
  Administered 2020-04-14: 650 mg via ORAL
  Filled 2020-04-14: qty 2

## 2020-04-14 MED ORDER — METHYLPREDNISOLONE SODIUM SUCC 125 MG IJ SOLR: 125.0000 mg | Freq: Once | INTRAMUSCULAR | Status: DC | PRN

## 2020-04-14 MED ORDER — EPINEPHRINE 0.3 MG/0.3ML IJ SOAJ: 0.3000 mg | Freq: Once | INTRAMUSCULAR | Status: DC | PRN

## 2020-04-14 MED ORDER — SODIUM CHLORIDE 0.9 % IV SOLN: Freq: Once | INTRAVENOUS | Status: AC

## 2020-04-14 MED ORDER — SODIUM CHLORIDE 0.9 % IV SOLN: INTRAVENOUS | Status: DC | PRN

## 2020-04-14 MED ORDER — ALBUTEROL SULFATE HFA 108 (90 BASE) MCG/ACT IN AERS: 2.0000 | INHALATION_SPRAY | Freq: Once | RESPIRATORY_TRACT | Status: DC | PRN

## 2020-04-14 MED ORDER — DIPHENHYDRAMINE HCL 50 MG/ML IJ SOLN: 50.0000 mg | Freq: Once | INTRAMUSCULAR | Status: DC | PRN

## 2020-04-14 MED ORDER — FAMOTIDINE IN NACL 20-0.9 MG/50ML-% IV SOLN: 20.0000 mg | Freq: Once | INTRAVENOUS | Status: DC | PRN

## 2020-04-14 NOTE — Discharge Instructions (Signed)
COVID-19 COVID-19 is a respiratory infection that is caused by a virus called severe acute respiratory syndrome coronavirus 2 (SARS-CoV-2). The disease is also known as coronavirus disease or novel coronavirus. In some people, the virus may not cause any symptoms. In others, it may cause a serious infection. The infection can get worse quickly and can lead to complications, such as:  Pneumonia, or infection of the lungs.  Acute respiratory distress syndrome or ARDS. This is a condition in which fluid build-up in the lungs prevents the lungs from filling with air and passing oxygen into the blood.  Acute respiratory failure. This is a condition in which there is not enough oxygen passing from the lungs to the body or when carbon dioxide is not passing from the lungs out of the body.  Sepsis or septic shock. This is a serious bodily reaction to an infection.  Blood clotting problems.  Secondary infections due to bacteria or fungus.  Organ failure. This is when your body's organs stop working. The virus that causes COVID-19 is contagious. This means that it can spread from person to person through droplets from coughs and sneezes (respiratory secretions). What are the causes? This illness is caused by a virus. You may catch the virus by:  Breathing in droplets from an infected person. Droplets can be spread by a person breathing, speaking, singing, coughing, or sneezing.  Touching something, like a table or a doorknob, that was exposed to the virus (contaminated) and then touching your mouth, nose, or eyes. What increases the risk? Risk for infection You are more likely to be infected with this virus if you:  Are within 6 feet (2 meters) of a person with COVID-19.  Provide care for or live with a person who is infected with COVID-19.  Spend time in crowded indoor spaces or live in shared housing. Risk for serious illness You are more likely to become seriously ill from the virus if  you:  Are 50 years of age or older. The higher your age, the more you are at risk for serious illness.  Live in a nursing home or long-term care facility.  Have cancer.  Have a long-term (chronic) disease such as: ? Chronic lung disease, including chronic obstructive pulmonary disease or asthma. ? A long-term disease that lowers your body's ability to fight infection (immunocompromised). ? Heart disease, including heart failure, a condition in which the arteries that lead to the heart become narrow or blocked (coronary artery disease), a disease which makes the heart muscle thick, weak, or stiff (cardiomyopathy). ? Diabetes. ? Chronic kidney disease. ? Sickle cell disease, a condition in which red blood cells have an abnormal "sickle" shape. ? Liver disease.  Are obese. What are the signs or symptoms? Symptoms of this condition can range from mild to severe. Symptoms may appear any time from 2 to 14 days after being exposed to the virus. They include:  A fever or chills.  A cough.  Difficulty breathing.  Headaches, body aches, or muscle aches.  Runny or stuffy (congested) nose.  A sore throat.  New loss of taste or smell. Some people may also have stomach problems, such as nausea, vomiting, or diarrhea. Other people may not have any symptoms of COVID-19. How is this diagnosed? This condition may be diagnosed based on:  Your signs and symptoms, especially if: ? You live in an area with a COVID-19 outbreak. ? You recently traveled to or from an area where the virus is common. ? You   provide care for or live with a person who was diagnosed with COVID-19. ? You were exposed to a person who was diagnosed with COVID-19.  A physical exam.  Lab tests, which may include: ? Taking a sample of fluid from the back of your nose and throat (nasopharyngeal fluid), your nose, or your throat using a swab. ? A sample of mucus from your lungs (sputum). ? Blood tests.  Imaging tests,  which may include, X-rays, CT scan, or ultrasound. How is this treated? At present, there is no medicine to treat COVID-19. Medicines that treat other diseases are being used on a trial basis to see if they are effective against COVID-19. Your health care provider will talk with you about ways to treat your symptoms. For most people, the infection is mild and can be managed at home with rest, fluids, and over-the-counter medicines. Treatment for a serious infection usually takes places in a hospital intensive care unit (ICU). It may include one or more of the following treatments. These treatments are given until your symptoms improve.  Receiving fluids and medicines through an IV.  Supplemental oxygen. Extra oxygen is given through a tube in the nose, a face mask, or a hood.  Positioning you to lie on your stomach (prone position). This makes it easier for oxygen to get into the lungs.  Continuous positive airway pressure (CPAP) or bi-level positive airway pressure (BPAP) machine. This treatment uses mild air pressure to keep the airways open. A tube that is connected to a motor delivers oxygen to the body.  Ventilator. This treatment moves air into and out of the lungs by using a tube that is placed in your windpipe.  Tracheostomy. This is a procedure to create a hole in the neck so that a breathing tube can be inserted.  Extracorporeal membrane oxygenation (ECMO). This procedure gives the lungs a chance to recover by taking over the functions of the heart and lungs. It supplies oxygen to the body and removes carbon dioxide. Follow these instructions at home: Lifestyle  If you are sick, stay home except to get medical care. Your health care provider will tell you how long to stay home. Call your health care provider before you go for medical care.  Rest at home as told by your health care provider.  Do not use any products that contain nicotine or tobacco, such as cigarettes,  e-cigarettes, and chewing tobacco. If you need help quitting, ask your health care provider.  Return to your normal activities as told by your health care provider. Ask your health care provider what activities are safe for you. General instructions  Take over-the-counter and prescription medicines only as told by your health care provider.  Drink enough fluid to keep your urine pale yellow.  Keep all follow-up visits as told by your health care provider. This is important. How is this prevented?  There is no vaccine to help prevent COVID-19 infection. However, there are steps you can take to protect yourself and others from this virus. To protect yourself:   Do not travel to areas where COVID-19 is a risk. The areas where COVID-19 is reported change often. To identify high-risk areas and travel restrictions, check the CDC travel website: wwwnc.cdc.gov/travel/notices  If you live in, or must travel to, an area where COVID-19 is a risk, take precautions to avoid infection. ? Stay away from people who are sick. ? Wash your hands often with soap and water for 20 seconds. If soap and water   are not available, use an alcohol-based hand sanitizer. ? Avoid touching your mouth, face, eyes, or nose. ? Avoid going out in public, follow guidance from your state and local health authorities. ? If you must go out in public, wear a cloth face covering or face mask. Make sure your mask covers your nose and mouth. ? Avoid crowded indoor spaces. Stay at least 6 feet (2 meters) away from others. ? Disinfect objects and surfaces that are frequently touched every day. This may include:  Counters and tables.  Doorknobs and light switches.  Sinks and faucets.  Electronics, such as phones, remote controls, keyboards, computers, and tablets. To protect others: If you have symptoms of COVID-19, take steps to prevent the virus from spreading to others.  If you think you have a COVID-19 infection, contact  your health care provider right away. Tell your health care team that you think you may have a COVID-19 infection.  Stay home. Leave your house only to seek medical care. Do not use public transport.  Do not travel while you are sick.  Wash your hands often with soap and water for 20 seconds. If soap and water are not available, use alcohol-based hand sanitizer.  Stay away from other members of your household. Let healthy household members care for children and pets, if possible. If you have to care for children or pets, wash your hands often and wear a mask. If possible, stay in your own room, separate from others. Use a different bathroom.  Make sure that all people in your household wash their hands well and often.  Cough or sneeze into a tissue or your sleeve or elbow. Do not cough or sneeze into your hand or into the air.  Wear a cloth face covering or face mask. Make sure your mask covers your nose and mouth. Where to find more information  Centers for Disease Control and Prevention: www.cdc.gov/coronavirus/2019-ncov/index.html  World Health Organization: www.who.int/health-topics/coronavirus Contact a health care provider if:  You live in or have traveled to an area where COVID-19 is a risk and you have symptoms of the infection.  You have had contact with someone who has COVID-19 and you have symptoms of the infection. Get help right away if:  You have trouble breathing.  You have pain or pressure in your chest.  You have confusion.  You have bluish lips and fingernails.  You have difficulty waking from sleep.  You have symptoms that get worse. These symptoms may represent a serious problem that is an emergency. Do not wait to see if the symptoms will go away. Get medical help right away. Call your local emergency services (911 in the [place] Do not drive yourself to the hospital. Let the emergency medical personnel know if you think you have  COVID-19. Summary  COVID-19 is a respiratory infection that is caused by a virus. It is also known as coronavirus disease or novel coronavirus. It can cause serious infections, such as pneumonia, acute respiratory distress syndrome, acute respiratory failure, or sepsis.  The virus that causes COVID-19 is contagious. This means that it can spread from person to person through droplets from breathing, speaking, singing, coughing, or sneezing.  You are more likely to develop a serious illness if you are 50 years of age or older, have a weak immune system, live in a nursing home, or have chronic disease.  There is no medicine to treat COVID-19. Your health care provider will talk with you about ways to treat your symptoms.    Take steps to protect yourself and others from infection. Wash your hands often and disinfect objects and surfaces that are frequently touched every day. Stay away from people who are sick and wear a mask if you are sick. This information is not intended to replace advice given to you by your health care provider. Make sure you discuss any questions you have with your health care provider. Document Revised: 04/17/2019 Document Reviewed: 07/24/2018 Elsevier Patient Education  2020 Elsevier Inc. What types of side effects do monoclonal antibody drugs cause?  Common side effects  In general, the more common side effects caused by monoclonal antibody drugs include: . Allergic reactions, such as hives or itching . Flu-like signs and symptoms, including chills, fatigue, fever, and muscle aches and pains . Nausea, vomiting . Diarrhea . Skin rashes . Low blood pressure   The CDC is recommending patients who receive monoclonal antibody treatments wait at least 90 days before being vaccinated.  Currently, there are no data on the safety and efficacy of mRNA COVID-19 vaccines in persons who received monoclonal antibodies or convalescent plasma as part of COVID-19 treatment. Based  on the estimated half-life of such therapies as well as evidence suggesting that reinfection is uncommon in the 90 days after initial infection, vaccination should be deferred for at least 90 days, as a precautionary measure until additional information becomes available, to avoid interference of the antibody treatment with vaccine-induced immune responses. 

## 2020-04-14 NOTE — Progress Notes (Signed)
  Diagnosis: COVID-19  Physician:dr wright  Procedure: Covid Infusion Clinic Med: bamlanivimab\etesevimab infusion - Provided patient with bamlanimivab\etesevimab fact sheet for patients, parents and caregivers prior to infusion.  Complications: No immediate complications noted.  Discharge: Discharged home   Maci Eickholt S Saranya Harlin 04/14/2020   

## 2020-04-15 ENCOUNTER — Encounter: Payer: Self-pay | Admitting: Physician Assistant

## 2020-04-15 ENCOUNTER — Other Ambulatory Visit: Payer: Self-pay | Admitting: Physician Assistant

## 2020-04-15 MED ORDER — CHERATUSSIN AC 100-10 MG/5ML PO SYRP: 5.0000 mL | ORAL_SOLUTION | Freq: Every evening | ORAL | 0 refills | Status: DC | PRN

## 2020-04-15 MED ORDER — AZITHROMYCIN 250 MG PO TABS: ORAL_TABLET | ORAL | 0 refills | Status: DC

## 2020-04-25 ENCOUNTER — Other Ambulatory Visit: Payer: BC Managed Care – PPO

## 2020-04-25 DIAGNOSIS — Z20822 Contact with and (suspected) exposure to covid-19: Secondary | ICD-10-CM

## 2020-04-26 LAB — NOVEL CORONAVIRUS, NAA: SARS-CoV-2, NAA: NOT DETECTED

## 2020-04-26 LAB — SARS-COV-2, NAA 2 DAY TAT

## 2020-05-17 ENCOUNTER — Other Ambulatory Visit: Payer: Self-pay

## 2020-05-17 ENCOUNTER — Encounter: Payer: Self-pay | Admitting: Physician Assistant

## 2020-05-17 ENCOUNTER — Ambulatory Visit (INDEPENDENT_AMBULATORY_CARE_PROVIDER_SITE_OTHER): Payer: BC Managed Care – PPO

## 2020-05-17 DIAGNOSIS — Z23 Encounter for immunization: Secondary | ICD-10-CM | POA: Diagnosis not present

## 2020-07-08 ENCOUNTER — Ambulatory Visit: Payer: BC Managed Care – PPO | Attending: Internal Medicine

## 2020-07-08 DIAGNOSIS — Z23 Encounter for immunization: Secondary | ICD-10-CM

## 2020-07-08 NOTE — Progress Notes (Signed)
   Covid-19 Vaccination Clinic  Name:  Thomas [name]    MRN: [id number] DOB: 02-01-1981  07/08/2020  Mr. He was observed post Covid-19 immunization for 15 minutes without incident. He was provided with Vaccine Information Sheet and instruction to access the V-Safe system.   [name] was instructed to call 911 with any severe reactions post vaccine: Marland Kitchen Difficulty breathing  . Swelling of face and throat  . A fast heartbeat  . A bad rash all over body  . Dizziness and weakness   Immunizations Administered    Name Date Dose VIS Date Route   Pfizer COVID-19 Vaccine 07/08/2020  4:11 PM 0.3 mL 04/20/2020 Intramuscular   Manufacturer: [employer], [employer]   Lot: [id number]   NDC: [address]-1

## 2020-07-11 ENCOUNTER — Other Ambulatory Visit: Payer: Self-pay | Admitting: Family Medicine

## 2020-07-11 DIAGNOSIS — E7800 Pure hypercholesterolemia, unspecified: Secondary | ICD-10-CM

## 2020-07-11 NOTE — Telephone Encounter (Signed)
Dr. Earlene Plater and no upcoming appointment

## 2020-07-12 ENCOUNTER — Other Ambulatory Visit: Payer: Self-pay | Admitting: Family Medicine

## 2020-07-12 DIAGNOSIS — E7800 Pure hypercholesterolemia, unspecified: Secondary | ICD-10-CM

## 2020-07-12 DIAGNOSIS — I10 Essential (primary) hypertension: Secondary | ICD-10-CM

## 2020-07-19 ENCOUNTER — Other Ambulatory Visit: Payer: Self-pay | Admitting: Physician Assistant

## 2020-07-19 ENCOUNTER — Other Ambulatory Visit: Payer: Self-pay | Admitting: Family Medicine

## 2020-07-19 ENCOUNTER — Encounter: Payer: Self-pay | Admitting: Physician Assistant

## 2020-07-19 DIAGNOSIS — I10 Essential (primary) hypertension: Secondary | ICD-10-CM

## 2020-07-19 DIAGNOSIS — G8929 Other chronic pain: Secondary | ICD-10-CM

## 2020-07-20 NOTE — Telephone Encounter (Signed)
Pt has an appt 1/26 °

## 2020-07-25 ENCOUNTER — Ambulatory Visit: Payer: BC Managed Care – PPO | Admitting: Physician Assistant

## 2020-07-27 ENCOUNTER — Encounter: Payer: BC Managed Care – PPO | Admitting: Physician Assistant

## 2020-08-01 ENCOUNTER — Ambulatory Visit (INDEPENDENT_AMBULATORY_CARE_PROVIDER_SITE_OTHER): Payer: BC Managed Care – PPO | Admitting: Physician Assistant

## 2020-08-01 ENCOUNTER — Encounter: Payer: Self-pay | Admitting: Physician Assistant

## 2020-08-01 ENCOUNTER — Other Ambulatory Visit: Payer: Self-pay

## 2020-08-01 ENCOUNTER — Other Ambulatory Visit: Payer: Self-pay | Admitting: Physician Assistant

## 2020-08-01 ENCOUNTER — Other Ambulatory Visit (INDEPENDENT_AMBULATORY_CARE_PROVIDER_SITE_OTHER): Payer: BC Managed Care – PPO

## 2020-08-01 VITALS — BP 124/80 | HR 87 | Temp 98.1°F | Ht 65.5 in | Wt 183.4 lb

## 2020-08-01 DIAGNOSIS — R748 Abnormal levels of other serum enzymes: Secondary | ICD-10-CM

## 2020-08-01 DIAGNOSIS — M79672 Pain in left foot: Secondary | ICD-10-CM

## 2020-08-01 DIAGNOSIS — I10 Essential (primary) hypertension: Secondary | ICD-10-CM | POA: Diagnosis not present

## 2020-08-01 DIAGNOSIS — Z0001 Encounter for general adult medical examination with abnormal findings: Secondary | ICD-10-CM | POA: Diagnosis not present

## 2020-08-01 DIAGNOSIS — J4530 Mild persistent asthma, uncomplicated: Secondary | ICD-10-CM | POA: Diagnosis not present

## 2020-08-01 DIAGNOSIS — R29818 Other symptoms and signs involving the nervous system: Secondary | ICD-10-CM

## 2020-08-01 DIAGNOSIS — E7800 Pure hypercholesterolemia, unspecified: Secondary | ICD-10-CM | POA: Diagnosis not present

## 2020-08-01 DIAGNOSIS — M79671 Pain in right foot: Secondary | ICD-10-CM | POA: Diagnosis not present

## 2020-08-01 DIAGNOSIS — E669 Obesity, unspecified: Secondary | ICD-10-CM

## 2020-08-01 LAB — COMPREHENSIVE METABOLIC PANEL
ALT: 22 U/L (ref 0–53)
AST: 17 U/L (ref 0–37)
Albumin: 4.7 g/dL (ref 3.5–5.2)
Alkaline Phosphatase: 124 U/L — ABNORMAL HIGH (ref 39–117)
BUN: 15 mg/dL (ref 6–23)
CO2: 31 mEq/L (ref 19–32)
Calcium: 9.8 mg/dL (ref 8.4–10.5)
Chloride: 102 mEq/L (ref 96–112)
Creatinine, Ser: 0.78 mg/dL (ref 0.40–1.50)
GFR: 112.25 mL/min (ref >60.00)
Glucose, Bld: 97 mg/dL (ref 70–99)
Potassium: 3.5 mEq/L (ref 3.5–5.1)
Sodium: 140 mEq/L (ref 135–145)
Total Bilirubin: 0.4 mg/dL (ref 0.2–1.2)
Total Protein: 7.6 g/dL (ref 6.0–8.3)

## 2020-08-01 LAB — LIPID PANEL
Cholesterol: 176 mg/dL (ref 0–200)
HDL: 42.7 mg/dL (ref >39.00)
NonHDL: 132.95
Total CHOL/HDL Ratio: 4
Triglycerides: 232 mg/dL — ABNORMAL HIGH (ref 0.0–149.0)
VLDL: 46.4 mg/dL — ABNORMAL HIGH (ref 0.0–40.0)

## 2020-08-01 LAB — LDL CHOLESTEROL, DIRECT: Direct LDL: 120 mg/dL

## 2020-08-01 LAB — CBC WITH DIFFERENTIAL/PLATELET
Basophils Absolute: 0.1 10*3/uL (ref 0.0–0.1)
Basophils Relative: 1.2 % (ref 0.0–3.0)
Eosinophils Absolute: 0.2 10*3/uL (ref 0.0–0.7)
Eosinophils Relative: 2.5 % (ref 0.0–5.0)
HCT: 45 % (ref 39.0–52.0)
Hemoglobin: 15.4 g/dL (ref 13.0–17.0)
Lymphocytes Relative: 35.2 % (ref 12.0–46.0)
Lymphs Abs: 3.3 10*3/uL (ref 0.7–4.0)
MCHC: 34.3 g/dL (ref 30.0–36.0)
MCV: 84.5 fl (ref 78.0–100.0)
Monocytes Absolute: 0.7 10*3/uL (ref 0.1–1.0)
Monocytes Relative: 7.3 % (ref 3.0–12.0)
Neutro Abs: 5.1 10*3/uL (ref 1.4–7.7)
Neutrophils Relative %: 53.8 % (ref 43.0–77.0)
Platelets: 350 10*3/uL (ref 150.0–400.0)
RBC: 5.32 Mil/uL (ref 4.22–5.81)
RDW: 13.2 % (ref 11.5–15.5)
WBC: 9.4 10*3/uL (ref 4.0–10.5)

## 2020-08-01 LAB — VITAMIN D 25 HYDROXY (VIT D DEFICIENCY, FRACTURES): VITD: 32 ng/mL (ref 30.00–100.00)

## 2020-08-01 MED ORDER — FLUTICASONE-SALMETEROL 100-50 MCG/DOSE IN AEPB: 1.0000 | INHALATION_SPRAY | Freq: Two times a day (BID) | RESPIRATORY_TRACT | 2 refills | Status: DC

## 2020-08-01 MED ORDER — SIMVASTATIN 40 MG PO TABS: 40.0000 mg | ORAL_TABLET | Freq: Every day | ORAL | 3 refills | Status: DC

## 2020-08-01 NOTE — Progress Notes (Signed)
I acted as a Education administrator for [employer], PA-C Anselmo Pickler, LPN   Subjective:    Thomas [name] is a 40 y.o. male and is here for a comprehensive physical exam.  HPI  Health Maintenance Due  Topic Date Due  . Hepatitis C Screening  Never done    Acute Concerns: Reactive airway-- currently using albuterol inhaler about 0-1 times a week but does get some intermittent chest wheeze sensation. Dealt with this a few years ago and had to have prednisone injection. Today he is asymptomatic. Bilateral heel pain -- heel cups have significantly improved his symptoms. Walks about 8-10k.   Chronic Issues: HTN -- Currently taking Norvasc 5 mg. At home blood pressure readings are: not checked. Patient denies chest pain, SOB, blurred vision, dizziness, unusual headaches, lower leg swelling. Patient is compliant with medication. Denies excessive caffeine intake, stimulant usage, excessive alcohol intake, or increase in salt consumption.  BP Readings from Last 3 Encounters:  08/01/20 124/80  04/14/20 (!) 140/94  01/12/20 (!) 156/88   HLD -- Taking zocor 20 mg daily. Tolerating well without issues. OSA -- suspected by wife. Has never been tested but he is interested in a sleep study for further evaluation. Elevated alk phos -- worked-up by prior PCP. Had normal GGT. Also had u/s that showed possible choledocholithiasis. He was evaluated by surgery and states that as long as he was not having symptoms he did not need surgery. This was with Dr. Johney Maine. He was recommended to follow-up with endocrinology due to elevated alk phos and normal GGT but this appointment was not scheduled for some reason.  Health Maintenance: Immunizations -- UTD Colonoscopy -- N/A PSA -- N/A Diet -- overall healthy, trying to eat more lean meats Caffeine intake -- denies significant intake Sleep habits -- sometimes unable to get much sleep due to work demand; has concerns for sleep apnea, see above Exercise  -- walks a lot because of her job Weight -- Weight: 183 lb 6.1 oz (83.2 kg)  Weight history Wt Readings from Last 10 Encounters:  08/01/20 183 lb 6.1 oz (83.2 kg)  01/12/20 182 lb (82.6 kg)  07/10/19 180 lb (81.6 kg)  04/20/19 176 lb (79.8 kg)  01/16/19 184 lb (83.5 kg)  08/29/18 183 lb 8 oz (83.2 kg)  08/15/18 188 lb 8 oz (85.5 kg)  07/18/18 184 lb 12.8 oz (83.8 kg)  05/21/18 184 lb 6.4 oz (83.6 kg)  01/15/18 179 lb 6.4 oz (81.4 kg)   Body mass index is 30.05 kg/m. Mood -- overall stable; denies concerns Tobacco use --  Tobacco Use: Low Risk   . Smoking Tobacco Use: Never Smoker  . Smokeless Tobacco Use: Never Used    Alcohol use ---  reports no history of alcohol use.   Depression screen [facility] 2/9 08/01/2020  Decreased Interest 0  Down, Depressed, Hopeless 0  PHQ - 2 Score 0  Altered sleeping -  Tired, decreased energy -  Change in appetite -  Feeling bad or failure about yourself  -  Trouble concentrating -  Moving slowly or fidgety/restless -  Suicidal thoughts -  PHQ-9 Score -  Difficult doing work/chores -     Other providers/specialists: Patient Care Team: Inda Coke, [place] as PCP - General (Physician Assistant)   PMHx, SurgHx, SocialHx, Medications, and Allergies were reviewed in the Visit Navigator and updated as appropriate.   Past Medical History:  Diagnosis Date  . Chronic back pain   . Deaf  History reviewed. No pertinent surgical history.   Family History  Problem Relation Age of Onset  . Mental illness Mother   . Cancer Maternal Grandfather        brain cancer  . Diabetes Paternal Grandmother   . Hypertension Paternal Grandmother   . Stroke Paternal Grandmother   . Prostate cancer Neg Hx   . Colon cancer Neg Hx     Social History   Tobacco Use  . Smoking status: Never Smoker  . Smokeless tobacco: Never Used  Substance Use Topics  . Alcohol use: No  . Drug use: No    Review of Systems:   Review of Systems   Constitutional: Negative for chills, fever, malaise/fatigue and weight loss.  HENT: Negative for hearing loss, sinus pain and sore throat.   Respiratory: Negative for cough and hemoptysis.   Cardiovascular: Negative for chest pain, palpitations, leg swelling and PND.  Gastrointestinal: Negative for abdominal pain, constipation, diarrhea, heartburn, nausea and vomiting.  Genitourinary: Negative for dysuria, frequency and urgency.  Musculoskeletal: Negative for back pain, myalgias and neck pain.  Skin: Negative for itching and rash.  Neurological: Negative for dizziness, tingling, seizures and headaches.  Endo/Heme/Allergies: Negative for polydipsia.  Psychiatric/Behavioral: Negative for depression. The patient is not nervous/anxious.     Objective:   Vitals:   08/01/20 1101  BP: 124/80  Pulse: 87  Temp: 98.1 F (36.7 C)  SpO2: 97%   Body mass index is 30.05 kg/m.  General Appearance:  Alert, cooperative, no distress, appears stated age  Head:  Normocephalic, without obvious abnormality, atraumatic  Eyes:  PERRL, conjunctiva/corneas clear, EOM's intact, fundi benign, both eyes       Ears:  Normal TM's and external ear canals, both ears  Nose: Nares normal, septum midline, mucosa normal, no drainage    or sinus tenderness  Throat: Lips, mucosa, and tongue normal; teeth and gums normal  Neck: Supple, symmetrical, trachea midline, no adenopathy; thyroid:  No enlargement/tenderness/nodules; no carotit bruit or JVD  Back:   Symmetric, no curvature, ROM normal, no CVA tenderness  Lungs:   Clear to auscultation bilaterally, respirations unlabored  Chest wall:  No tenderness or deformity  Heart:  Regular rate and rhythm, S1 and S2 normal, no murmur, rub   or gallop  Abdomen:   Soft, non-tender, bowel sounds active all four quadrants, no masses, no organomegaly  Extremities: Extremities normal, atraumatic, no cyanosis or edema  Prostate: Not done.   Skin: Skin color, texture, turgor  normal, no rashes or lesions  Lymph nodes: Cervical, supraclavicular, and axillary nodes normal  Neurologic: CNII-XII grossly intact. Normal strength, sensation and reflexes throughout    Results for orders placed or performed in visit on 04/25/20  Novel Coronavirus, NAA ([facility])   Specimen: Nasopharyngeal(NP) swabs in vial transport medium   Nasopharynge  Result Value Ref Range   SARS-CoV-2, NAA Not Detected Not Detected  SARS-COV-2, NAA 2 DAY TAT   Nasopharynge  Result Value Ref Range   SARS-CoV-2, NAA 2 DAY TAT Performed     Assessment/Plan:   Wilberto was seen today for annual exam.  Diagnoses and all orders for this visit:  Encounter for general adult medical examination with abnormal findings Today patient counseled on age appropriate routine health concerns for screening and prevention, each reviewed and up to date or declined. Immunizations reviewed and up to date or declined. Labs ordered and reviewed. Risk factors for depression reviewed and negative. Hearing function and visual acuity are intact. ADLs screened and  addressed as needed. Functional ability and level of safety reviewed and appropriate. Education, counseling and referrals performed based on assessed risks today. Patient provided with a copy of personalized plan for preventive services.  Mild persistent reactive airway disease without complication No wheezing/red flags on today's exam. Recommend trial of ICS for winter months, Advair sent in, to see if this helps his symptoms. If lack of improvement or other concerns, follow-up as needed.  Heel pain, bilateral Resolved with heel cups. Likely plantar fasciitis. Follow-up as needed.  Pure hypercholesterolemia Update lipid panel and adjust zocor 20 mg daily as needed. -     Lipid panel  Essential hypertension Well controlled with Norvasc 5 mg daily. Continue healthy lifestyle. -     CBC with Differential/Platelet -     Comprehensive metabolic  panel  Suspected sleep apnea Referral placed.  Alkaline phosphatase elevation Update blood work today. Add on Vitamin D level for further evaluation. May bring back for PTH since this could not be added on. Consider endo referral based on blood work results since GGT has been negative in the past. -     VITAMIN D 25 Hydroxy (Vit-D Deficiency, Fractures); Future  Other orders -     Fluticasone-Salmeterol (ADVAIR DISKUS) 100-50 MCG/DOSE AEPB; Inhale 1 puff into the lungs in the morning and at bedtime.  Well Adult Exam: Labs ordered: Yes. Patient counseling was done. See below for items discussed. Discussed the patient's BMI.  The BMI is not in the acceptable range; BMI management plan is completed Follow up as needed for acute illness.  Patient Counseling: '[x]'   Nutrition: Stressed importance of moderation in sodium/caffeine intake, saturated fat and cholesterol, caloric balance, sufficient intake of fresh fruits, vegetables, and fiber.  '[x]'   Stressed the importance of regular exercise.   '[]'   Substance Abuse: Discussed cessation/primary prevention of tobacco, alcohol, or other drug use; driving or other dangerous activities under the influence; availability of treatment for abuse.   '[x]'   Injury prevention: Discussed safety belts, safety helmets, smoke detector, smoking near bedding or upholstery.   '[]'   Sexuality: Discussed sexually transmitted diseases, partner selection, use of condoms, avoidance of unintended pregnancy  and contraceptive alternatives.   '[x]'   Dental health: Discussed importance of regular tooth brushing, flossing, and dental visits.  '[x]'   Health maintenance and immunizations reviewed. Please refer to Health maintenance section.    CMA or LPN served as scribe during this visit. History, Physical, and Plan performed by medical provider. The above documentation has been reviewed and is accurate and complete.   Inda Coke, PA-C [place]

## 2020-08-01 NOTE — Addendum Note (Signed)
Addended by: Lurlean Horns on: 08/01/2020 02:36 PM   Modules accepted: Orders

## 2020-08-01 NOTE — Patient Instructions (Signed)
It was great to see you!  Sleep study referral eventually Update blood work today Start Advair twice daily, may use Albuterol as needed.  Please go to the lab for blood work.   Our office will call you with your results unless you have chosen to receive results via MyChart.  If your blood work is normal we will follow-up each year for physicals and as scheduled for chronic medical problems.  If anything is abnormal we will treat accordingly and get you in for a follow-up.  Take care,  [facility] Maintenance, Male Adopting a healthy lifestyle and getting preventive care are important in promoting health and wellness. Ask your health care provider about:  The right schedule for you to have regular tests and exams.  Things you can do on your own to prevent diseases and keep yourself healthy. What should I know about diet, weight, and exercise? Eat a healthy diet  Eat a diet that includes plenty of vegetables, fruits, low-fat dairy products, and lean protein.  Do not eat a lot of foods that are high in solid fats, added sugars, or sodium.   Maintain a healthy weight Body mass index (BMI) is a measurement that can be used to identify possible weight problems. It estimates body fat based on height and weight. Your health care provider can help determine your BMI and help you achieve or maintain a healthy weight. Get regular exercise Get regular exercise. This is one of the most important things you can do for your health. Most adults should:  Exercise for at least 150 minutes each week. The exercise should increase your heart rate and make you sweat (moderate-intensity exercise).  Do strengthening exercises at least twice a week. This is in addition to the moderate-intensity exercise.  Spend less time sitting. Even light physical activity can be beneficial. Watch cholesterol and blood lipids Have your blood tested for lipids and cholesterol at 40 years of age, then have  this test every 5 years. You may need to have your cholesterol levels checked more often if:  Your lipid or cholesterol levels are high.  You are older than 40 years of age.  You are at high risk for heart disease. What should I know about cancer screening? Many types of cancers can be detected early and may often be prevented. Depending on your health history and family history, you may need to have cancer screening at various ages. This may include screening for:  Colorectal cancer.  Prostate cancer.  Skin cancer.  Lung cancer. What should I know about heart disease, diabetes, and high blood pressure? Blood pressure and heart disease  High blood pressure causes heart disease and increases the risk of stroke. This is more likely to develop in people who have high blood pressure readings, are of African descent, or are overweight.  Talk with your health care provider about your target blood pressure readings.  Have your blood pressure checked: ? Every 3-5 years if you are 47-32 years of age. ? Every year if you are 56 years old or older.  If you are between the ages of 77 and 58 and are a current or former smoker, ask your health care provider if you should have a one-time screening for abdominal aortic aneurysm (AAA). Diabetes Have regular diabetes screenings. This checks your fasting blood sugar level. Have the screening done:  Once every three years after age 36 if you are at a normal weight and have a low risk  for diabetes.  More often and at a younger age if you are overweight or have a high risk for diabetes. What should I know about preventing infection? Hepatitis B If you have a higher risk for hepatitis B, you should be screened for this virus. Talk with your health care provider to find out if you are at risk for hepatitis B infection. Hepatitis C Blood testing is recommended for:  Everyone born from [birth year] through 1965.  Anyone with known risk factors for  hepatitis C. Sexually transmitted infections (STIs)  You should be screened each year for STIs, including gonorrhea and chlamydia, if: ? You are sexually active and are younger than 40 years of age. ? You are older than 40 years of age and your health care provider tells you that you are at risk for this type of infection. ? Your sexual activity has changed since you were last screened, and you are at increased risk for chlamydia or gonorrhea. Ask your health care provider if you are at risk.  Ask your health care provider about whether you are at high risk for HIV. Your health care provider may recommend a prescription medicine to help prevent HIV infection. If you choose to take medicine to prevent HIV, you should first get tested for HIV. You should then be tested every 3 months for as long as you are taking the medicine. Follow these instructions at home: Lifestyle  Do not use any products that contain nicotine or tobacco, such as cigarettes, e-cigarettes, and chewing tobacco. If you need help quitting, ask your health care provider.  Do not use street drugs.  Do not share needles.  Ask your health care provider for help if you need support or information about quitting drugs. Alcohol use  Do not drink alcohol if your health care provider tells you not to drink.  If you drink alcohol: ? Limit how much you have to 0-2 drinks a day. ? Be aware of how much alcohol is in your drink. In the [place] one drink equals one 12 oz bottle of beer (355 mL), one 5 oz glass of wine (148 mL), or one 1 oz glass of hard liquor (44 mL). General instructions  Schedule regular health, dental, and eye exams.  Stay current with your vaccines.  Tell your health care provider if: ? You often feel depressed. ? You have ever been abused or do not feel safe at home. Summary  Adopting a healthy lifestyle and getting preventive care are important in promoting health and wellness.  Follow your health care  provider's instructions about healthy diet, exercising, and getting tested or screened for diseases.  Follow your health care provider's instructions on monitoring your cholesterol and blood pressure. This information is not intended to replace advice given to you by your health care provider. Make sure you discuss any questions you have with your health care provider. Document Revised: 06/11/2018 Document Reviewed: 06/11/2018 Elsevier Patient Education  2021 [employer].

## 2020-08-02 ENCOUNTER — Other Ambulatory Visit (INDEPENDENT_AMBULATORY_CARE_PROVIDER_SITE_OTHER): Payer: BC Managed Care – PPO

## 2020-08-02 DIAGNOSIS — R748 Abnormal levels of other serum enzymes: Secondary | ICD-10-CM | POA: Diagnosis not present

## 2020-08-03 LAB — PTH, INTACT AND CALCIUM
Calcium: 9.5 mg/dL (ref 8.6–10.3)
PTH: 23 pg/mL (ref 14–64)

## 2020-09-05 ENCOUNTER — Ambulatory Visit (INDEPENDENT_AMBULATORY_CARE_PROVIDER_SITE_OTHER): Payer: BC Managed Care – PPO | Admitting: Neurology

## 2020-09-05 ENCOUNTER — Other Ambulatory Visit: Payer: Self-pay

## 2020-09-05 ENCOUNTER — Encounter: Payer: Self-pay | Admitting: Neurology

## 2020-09-05 VITALS — BP 161/97 | HR 83 | Ht 66.0 in | Wt 189.0 lb

## 2020-09-05 DIAGNOSIS — E669 Obesity, unspecified: Secondary | ICD-10-CM

## 2020-09-05 DIAGNOSIS — R351 Nocturia: Secondary | ICD-10-CM

## 2020-09-05 DIAGNOSIS — R0683 Snoring: Secondary | ICD-10-CM

## 2020-09-05 DIAGNOSIS — R030 Elevated blood-pressure reading, without diagnosis of hypertension: Secondary | ICD-10-CM | POA: Diagnosis not present

## 2020-09-05 DIAGNOSIS — R419 Unspecified symptoms and signs involving cognitive functions and awareness: Secondary | ICD-10-CM

## 2020-09-05 DIAGNOSIS — Z9189 Other specified personal risk factors, not elsewhere classified: Secondary | ICD-10-CM

## 2020-09-05 DIAGNOSIS — R0681 Apnea, not elsewhere classified: Secondary | ICD-10-CM

## 2020-09-05 NOTE — Patient Instructions (Signed)

## 2020-09-05 NOTE — Progress Notes (Signed)
Subjective:    Patient ID: Thomas [name] is a 40 y.o. male.  HPI     Thomas Foley, MD, PhD [facility] [address], [address] [address] [place], [place] [address]  Dear Thomas [name],   I saw your patient, Thomas [name], upon your kind request, in my Sleep clinic today for initial consultation of his sleep disorder, in particular, concern for underlying obstructive sleep apnea.  The patient is accompanied by a sign language interpreter, Thomas [name], today.  As you know, Thomas [name] is 40 year old gentleman with an underlying medical history of chronic back pain, Reactive airway disease, hyperlipidemia, hypertension, deafness, and borderline obesity, who reports snoring and witnessed apneas, per wife.  He reports that he was supposed to have a sleep study a few years ago when he started having blood pressure issues.  He did not pursue sleep testing at the time.  He used to have morning headaches but recently with better blood pressure control and prescription eyeglasses his headaches have not been apparent.  He does have nocturia about twice per average night.  He works 2 jobs, he works for [employer] from [employer] AM to 9 AM Wednesdays through Saturdays.  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  Generally, he tries to be in bed around 7 and is typically asleep by 7:45 PM.  Rise time is at 3 AM.  He lives with his wife, they have 3 cats in the household.  He drinks caffeine up to 3 sodas per day on average, more so on his days off which are Sunday through Tuesday.  He has had some weight fluctuation, mild increase recently.  He is not aware of any family history of sleep apnea.  He is a non-smoker and does not drink any alcohol.  I reviewed your office note from 2020-08-01.  His Epworth sleepiness score is 7 out of 24, fatigue severity score is 53 out of 63. In the past 6 months he has had some cognitive complaints, including forgetfulness, short-term memory  issues.  His Past Medical History Is Significant For: Past Medical History:  Diagnosis Date  . Chronic back pain   . Deaf     His Past Surgical History Is Significant For: No past surgical history on file.  His Family History Is Significant For: Family History  Problem Relation Age of Onset  . Mental illness Mother   . Cancer Maternal Grandfather        brain cancer  . Diabetes Paternal Grandmother   . Hypertension Paternal Grandmother   . Stroke Paternal Grandmother   . Prostate cancer Neg Hx   . Colon cancer Neg Hx     His Social History Is Significant For: Social History   Socioeconomic History  . Marital status: Married    Spouse name: Not on file  . Number of children: Not on file  . Years of education: Not on file  . Highest education level: Not on file  Occupational History  . Not on file  Tobacco Use  . Smoking status: Never Smoker  . Smokeless tobacco: Never Used  Substance and Sexual Activity  . Alcohol use: No  . Drug use: No  . Sexual activity: Yes    Partners: Female    Birth control/protection: None  Other Topics Concern  . Not on file  Social History Narrative  . Not on file   Social Determinants of Health   Financial Resource Strain: Not on  file  Food Insecurity: Not on file  Transportation Needs: Not on file  Physical Activity: Not on file  Stress: Not on file  Social Connections: Not on file    His Allergies Are:  No Known Allergies:   His Current Medications Are:  Outpatient Encounter Medications as of 09/05/2020  Medication Sig  . albuterol (PROVENTIL HFA;VENTOLIN HFA) 108 (90 Base) MCG/ACT inhaler Inhale 2 puffs into the lungs every 6 (six) hours as needed for wheezing or shortness of breath.  Marland Kitchen amLODipine (NORVASC) 5 MG tablet Take 1 tablet (5 mg total) by mouth daily.  . Fluticasone-Salmeterol (ADVAIR DISKUS) 100-50 MCG/DOSE AEPB Inhale 1 puff into the lungs in the morning and at bedtime.  . simvastatin (ZOCOR) 40 MG tablet  Take 1 tablet (40 mg total) by mouth at bedtime.  . traMADol (ULTRAM) 50 MG tablet TAKE 1 TABLET (50 MG TOTAL) BY MOUTH EVERY 8 (EIGHT) HOURS AS NEEDED. FOR CHRONIC PAIN   No facility-administered encounter medications on file as of 09/05/2020.  :  Review of Systems:  Out of a complete 14 point review of systems, all are reviewed and negative with the exception of these symptoms as listed below: Review of Systems  Neurological:       Here for sleep consult. Hx of head aches and high BP. Pt reports he does a snore and no prior sleep study. Epworth Sleepiness Scale 0= would never doze 1= slight chance of dozing 2= moderate chance of dozing 3= high chance of dozing  Sitting and reading:2 Watching TV:2 Sitting inactive in a public place (ex. Theater or meeting):0 As a passenger in a car for an hour without a break:0 Lying down to rest in the afternoon:3 Sitting and talking to someone:0 Sitting quietly after lunch (no alcohol):0 In a car, while stopped in traffic:0 Total:7     Objective:  Neurological Exam  Physical Exam Physical Examination:   Vitals:   09/05/20 0858  BP: (!) 161/97  Pulse: 83    General Examination: The patient is a very pleasant 40 y.o. male in no acute distress. He appears well-developed and well-nourished and well groomed.   HEENT: Normocephalic, atraumatic, pupils are equal, round and reactive to light, extraocular tracking is good without limitation to gaze excursion or nystagmus noted. Hearing is grossly intact. Face is symmetric with normal facial animation. Speech is n/a. No issues with signing and comprehension.  There is no lip, neck/head, jaw or voice tremor. Neck is supple with full range of passive and active motion. There are no carotid bruits on auscultation. Oropharynx exam reveals: No significant mouth dryness, good dental hygiene, mild airway crowding secondary to tonsillar size of 1-2+, normal-sized uvula, normal tongue, Mallampati class II.   Neck circumference of 16 1/4 inches.  Tongue protrudes centrally and palate elevates symmetrically.  He has a mild overbite.   Chest: Clear to auscultation without wheezing, rhonchi or crackles noted.  Heart: S1+S2+0, regular and normal without murmurs, rubs or gallops noted.   Abdomen: Soft, non-tender and non-distended with normal bowel sounds appreciated on auscultation.  Extremities: There is no pitting edema in the distal lower extremities bilaterally.   Skin: Warm and dry without trophic changes noted.   Musculoskeletal: exam reveals no obvious joint deformities, tenderness or joint swelling or erythema.   Neurologically:  Mental status: The patient is awake, alert and oriented in all 4 spheres. His immediate and remote memory, attention, language skills and fund of knowledge are appropriate. Thought process is linear. Mood is  normal and affect is normal.  Cranial nerves II - XII are as described above under HEENT exam.  Motor exam: Normal bulk, strength and tone is noted. There is no tremor, Romberg is negative. Fine motor skills and coordination: grossly intact.  Cerebellar testing: No dysmetria or intention tremor. There is no truncal or gait ataxia.  Sensory exam: intact to light touch in the upper and lower extremities.  Gait, station and balance: He stands easily. No veering to one side is noted. No leaning to one side is noted. Posture is age-appropriate and stance is narrow based. Gait shows normal stride length and normal pace. No problems turning are noted.   Assessment and Plan:  In summary, Thomas [name] is a very pleasant 40 y.o.-year old male with an underlying medical history of chronic back pain, Reactive airway disease, hyperlipidemia, hypertension, deafness, and borderline obesity, whose history and physical exam are concerning for obstructive sleep apnea (OSA). I had a long chat with the patient about my findings and the diagnosis of OSA, its prognosis  and treatment options. We talked about medical treatments, surgical interventions and non-pharmacological approaches. I explained in particular the risks and ramifications of untreated moderate to severe OSA, especially with respect to developing cardiovascular disease down the Road, including congestive heart failure, difficult to treat hypertension, cardiac arrhythmias, or stroke. Even type 2 diabetes has, in part, been linked to untreated OSA. Symptoms of untreated OSA include daytime sleepiness, memory problems, mood irritability and mood disorder such as depression and anxiety, lack of energy, as well as recurrent headaches, especially morning headaches. We talked about trying to maintain a healthy lifestyle in general, as well as the importance of weight control. We also talked about the importance of good sleep hygiene. I recommended the following at this time: sleep study. He may prefer a home sleep test.   I explained the sleep test procedure to the patient and also outlined possible surgical and non-surgical treatment options of OSA, including the use of a custom-made dental device (which would require a referral to a specialist dentist or oral surgeon), upper airway surgical options, such as traditional UPPP or a novel less invasive surgical option in the form of Inspire hypoglossal nerve stimulation (which would involve a referral to an ENT surgeon). I also explained the CPAP treatment option to the patient, who indicated that he would be willing to try CPAP if the need arises. I explained the importance of being compliant with PAP treatment, not only for insurance purposes but primarily to improve His symptoms, and for the patient's long term health benefit, including to reduce Her cardiovascular risks. I answered all his questions today and the patient was in agreement. I plan to see him back after the sleep study is completed and encouraged him to call with any interim questions, concerns,  problems or updates.   Thank you very much for allowing me to participate in the care of this nice patient. If I can be of any further assistance to you please do not hesitate to call me at [phone].  Sincerely,   Thomas Foley, MD, PhD

## 2020-09-06 ENCOUNTER — Telehealth: Payer: Self-pay

## 2020-09-06 NOTE — Telephone Encounter (Signed)
LVM for pt to call me back to schedule sleep study  

## 2020-09-08 ENCOUNTER — Telehealth: Payer: Self-pay

## 2020-09-08 NOTE — Telephone Encounter (Signed)
LVM for pt to call me back to schedule sleep study  

## 2020-09-12 ENCOUNTER — Telehealth: Payer: Self-pay

## 2020-09-12 NOTE — Telephone Encounter (Signed)
We have attempted to call the patient two times to schedule sleep study.  Patient has been unavailable at the phone numbers we have on file and has not returned our calls. If patient calls back we will schedule them for their sleep study.  

## 2020-09-24 IMAGING — US ULTRASOUND ABDOMEN LIMITED
1 series · 13 of 25 positions shown · non-contrast
Comparison: None.

CLINICAL DATA: Elevated alkaline phosphatase

EXAM:
ULTRASOUND ABDOMEN LIMITED RIGHT UPPER QUADRANT

[Series 1: ultrasound abdomen limited · 0.23mm/px · 13 of 51 slices shown]
[im 1/51]
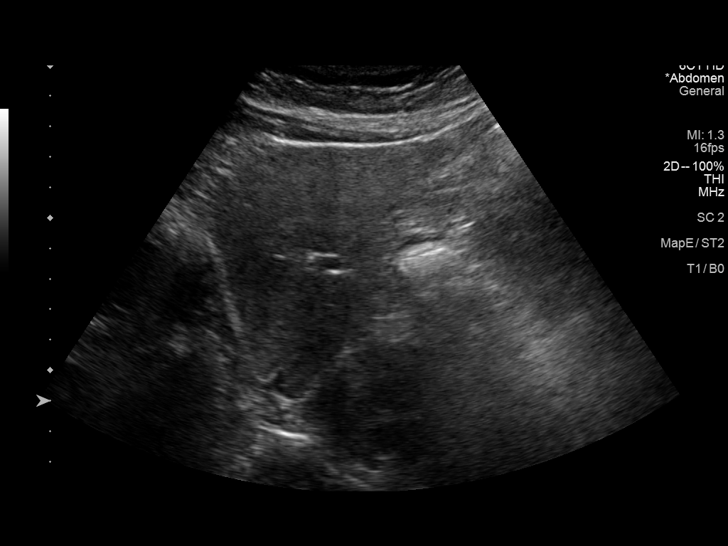
[im 5/51]
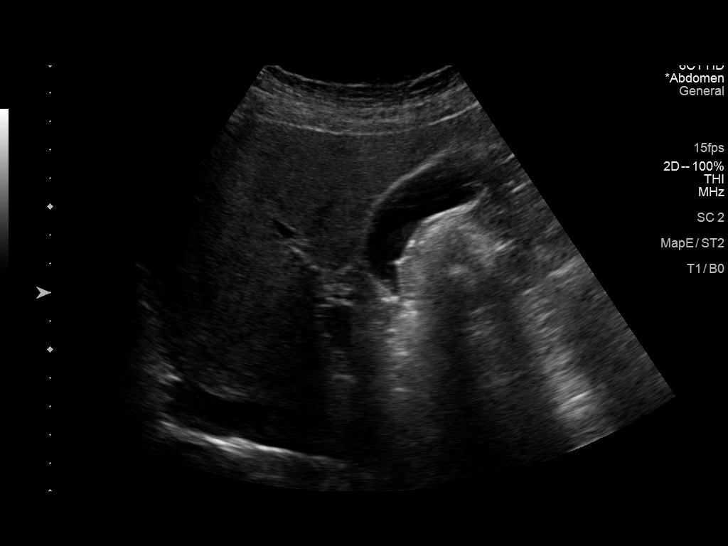
[im 9/51]
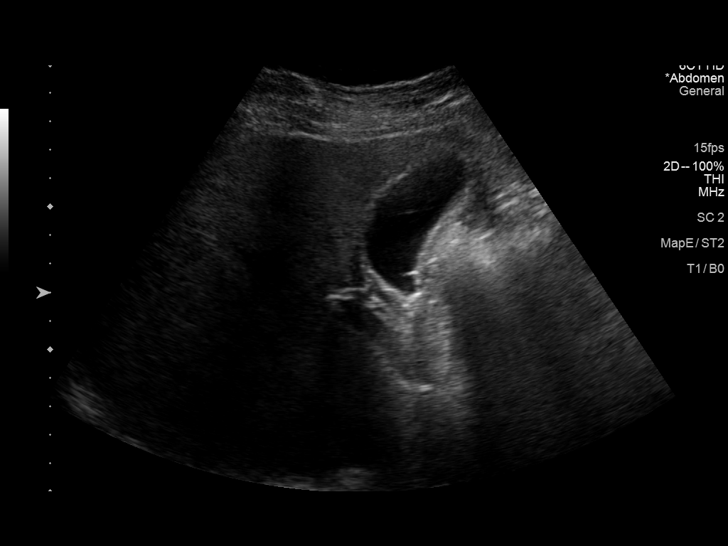
[im 13/51]
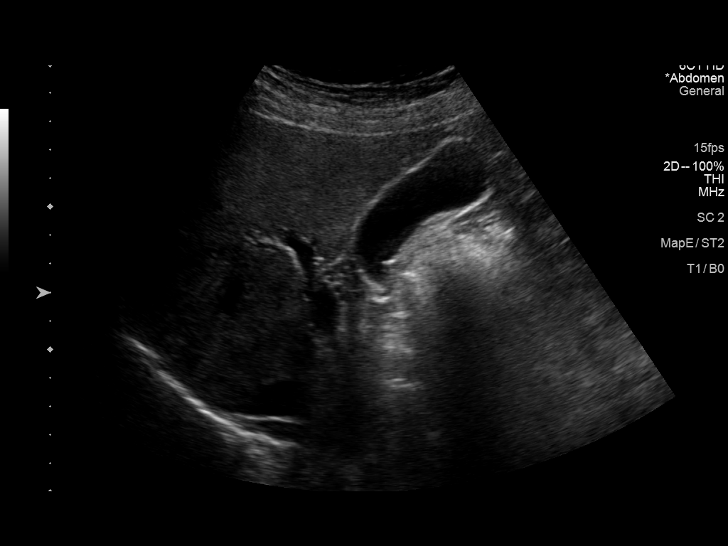
[im 17/51]
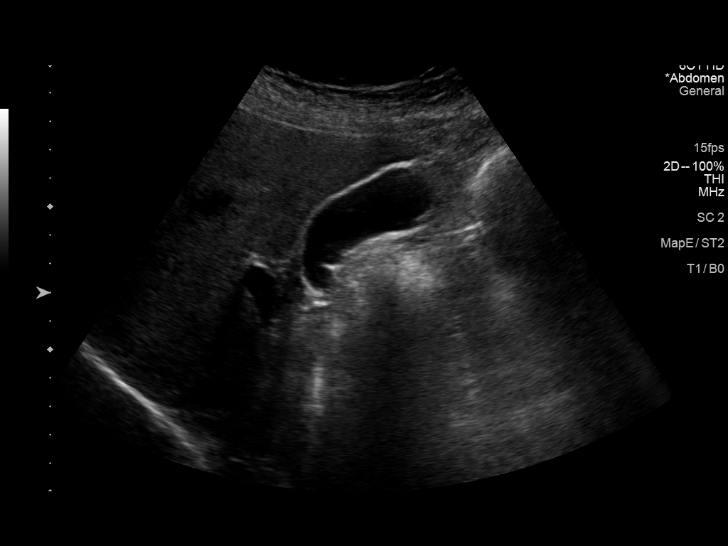
[im 21/51]
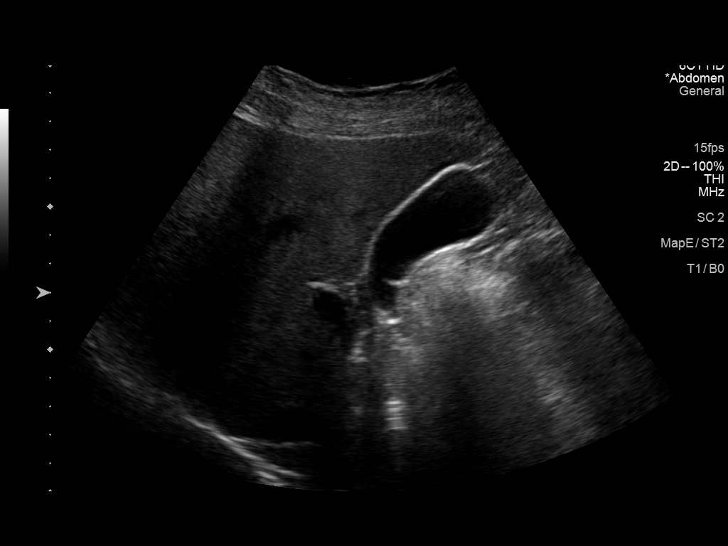
[im 26/51]
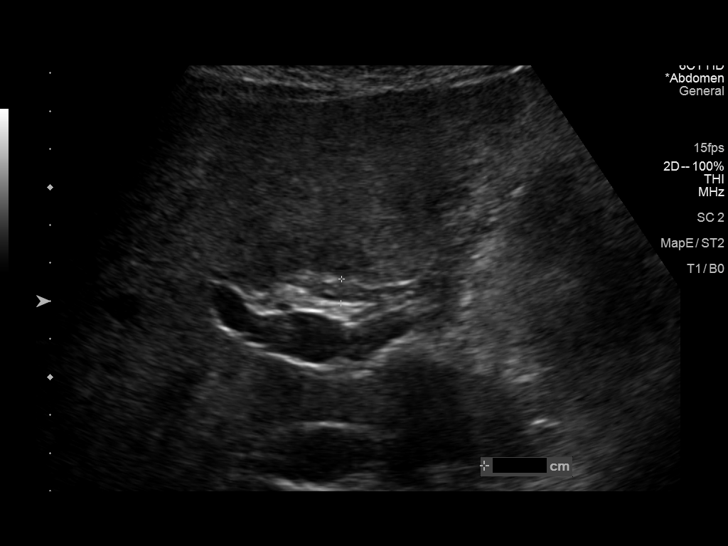
[im 30/51]
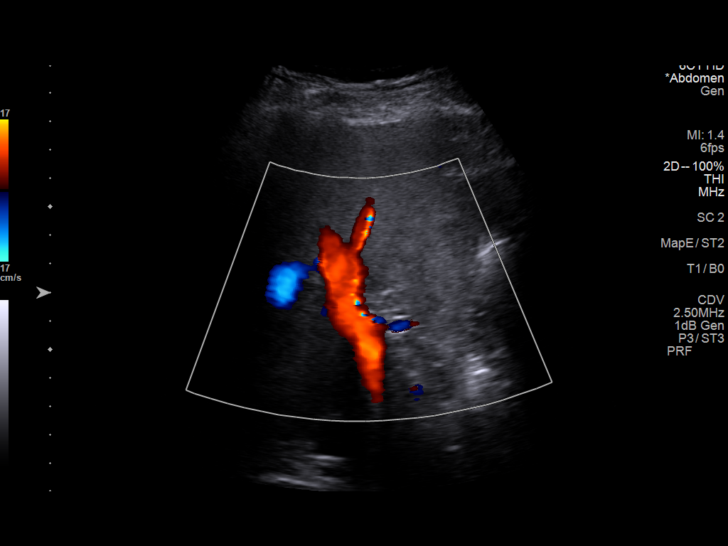
[im 34/51]
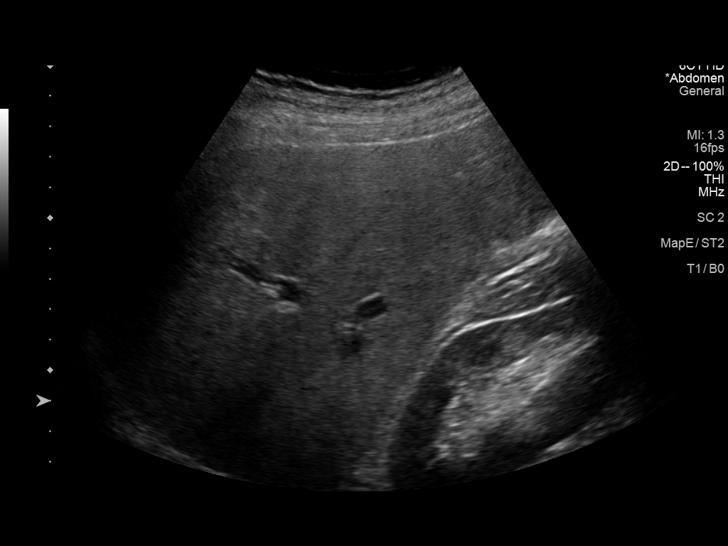
[im 38/51]
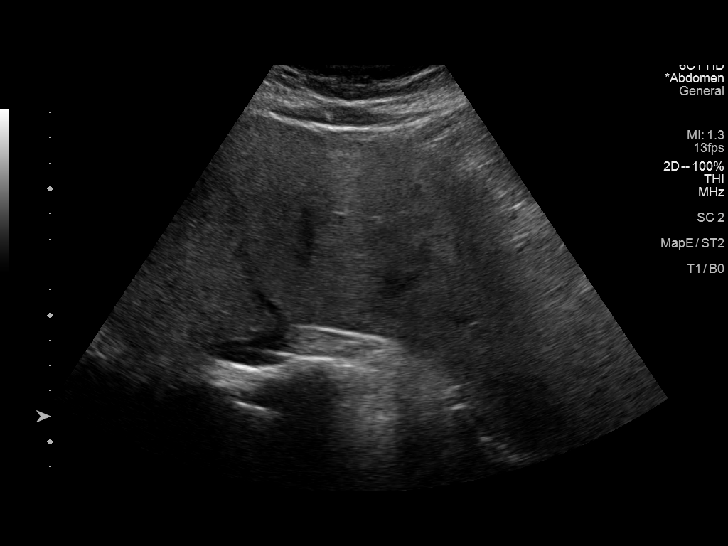
[im 42/51]
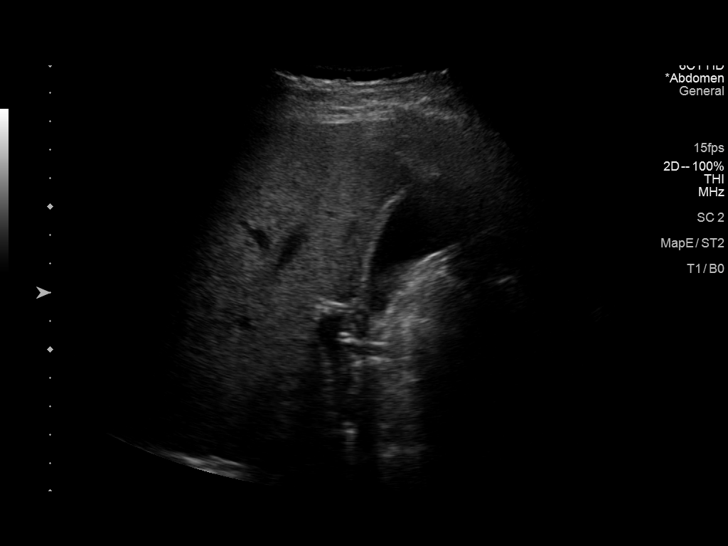
[im 46/51]
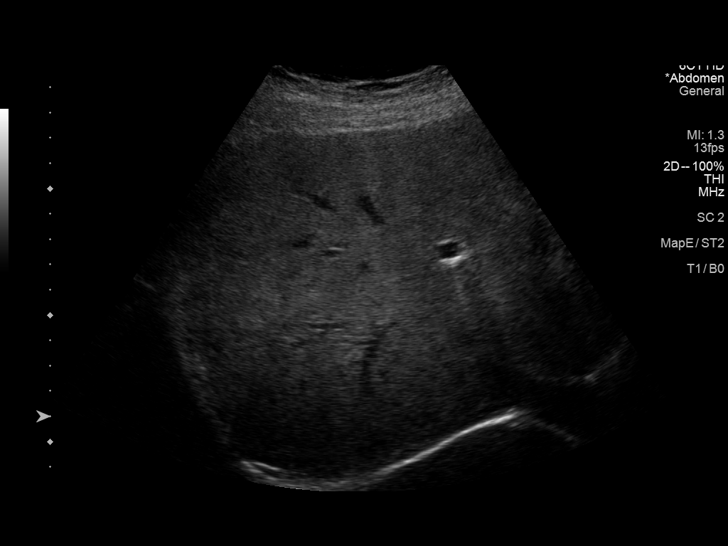
[im 51/51]
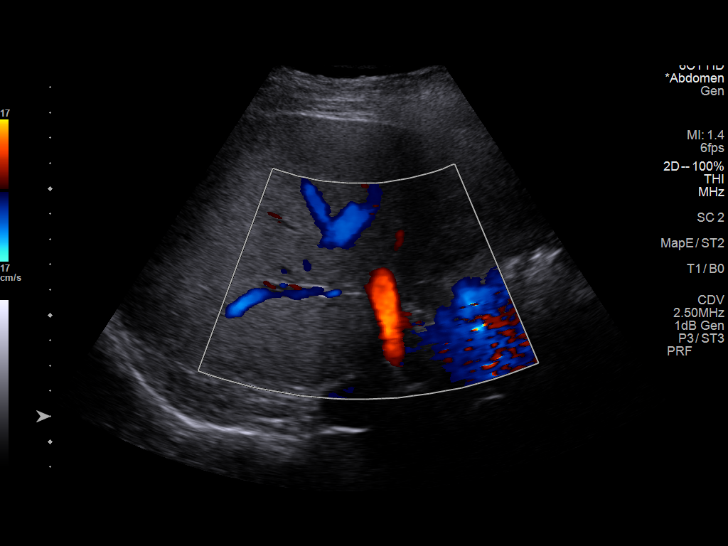

[13 of 25 positions shown; findings below may reference images not displayed]

FINDINGS: Gallbladder:

There is no gallbladder wall thickening. The sonographic Murphy sign
is reported as negative. There is a possible 5 mm stone near the
gallbladder neck. The gallbladder is mildly distended.

Common bile duct:

Diameter: 0.6 cm. There may be a few filling defects within the
common bile duct.

Liver:

Diffuse increased echogenicity with slightly heterogeneous liver.
Appearance typically secondary to fatty infiltration. Fibrosis
secondary consideration. No secondary findings of cirrhosis noted.
There is no focal hepatic lesion. There is some mild intrahepatic
biliary ductal dilatation. Portal vein is patent on color Doppler
imaging with normal direction of blood flow towards the liver.

Other: None.
IMPRESSION: 1. Probable cholelithiasis without sonographic evidence for acute
cholecystitis. There appears to be a stone near the gallbladder
neck.
2. Mildly dilated common bile duct with possible filling defects
within the proximal CBD concerning for choledocholithiasis.
3. Mild intrahepatic biliary ductal dilatation.
4. Hepatic steatosis.

## 2020-10-10 ENCOUNTER — Ambulatory Visit (INDEPENDENT_AMBULATORY_CARE_PROVIDER_SITE_OTHER): Payer: BC Managed Care – PPO | Admitting: Neurology

## 2020-10-10 DIAGNOSIS — G4733 Obstructive sleep apnea (adult) (pediatric): Secondary | ICD-10-CM

## 2020-10-10 DIAGNOSIS — E669 Obesity, unspecified: Secondary | ICD-10-CM

## 2020-10-10 DIAGNOSIS — R0681 Apnea, not elsewhere classified: Secondary | ICD-10-CM

## 2020-10-10 DIAGNOSIS — R030 Elevated blood-pressure reading, without diagnosis of hypertension: Secondary | ICD-10-CM

## 2020-10-10 DIAGNOSIS — R0683 Snoring: Secondary | ICD-10-CM

## 2020-10-10 DIAGNOSIS — R351 Nocturia: Secondary | ICD-10-CM

## 2020-10-10 DIAGNOSIS — Z9189 Other specified personal risk factors, not elsewhere classified: Secondary | ICD-10-CM

## 2020-10-18 NOTE — Procedures (Signed)
   [facility]  HOME SLEEP TEST (Watch PAT)  STUDY DATE: 10/10/20  DOB: 02-Sep-1980  MRN: [id number]  ORDERING CLINICIAN: [name], MD, PhD   REFERRING CLINICIANJarold Motto, [place]   CLINICAL INFORMATION/HISTORY: 40 year old man with a history of chronic back pain, Reactive airway disease, hyperlipidemia, hypertension, deafness, and borderline obesity, who reports snoring and witnessed apneas, per wife.   Epworth sleepiness score: 7/24.  BMI: 30.51 kg/m  Neck Circumference: 16.25 "  FINDINGS:   Total Record Time (hours, min): 9 H 30 min  Total Sleep Time (hours, min):  7 H 30 min  Percent REM (%):    24.43 %   Calculated pAHI (per hour):  3.9       REM pAHI: 6.6    NREM pAHI: 3.0 Supine AHI: 2.5   Oxygen Saturation (%) Mean: 95  Minimum oxygen saturation (%):        87   O2 Saturation Range (%): 87-98  O2Saturation (minutes) <=88%: 0 min  Pulse Mean (bpm):    72  Pulse Range (45-110)   IMPRESSION: Primary snoring   RECOMMENDATION:  This home sleep test does not demonstrate any significant obstructive or central sleep disordered breathing, overall AHI was 3.9/h.  A few brief desaturations were noted.  Snoring was noted and appeared to be in the mild to moderate range.  For disturbing snoring, a dental device may be considered.  Positive airway pressure treatment with a CPAP or AutoPap are not warranted.  Weight loss and avoiding the supine sleep position may aid in reducing snoring.  Other causes of the patient's symptoms, including circadian rhythm disturbances, an underlying mood disorder, medication effect and/or an underlying medical problem cannot be ruled out based on this test. Clinical correlation is recommended. The patient should be cautioned not to drive, work at heights, or operate dangerous or heavy equipment when tired or sleepy. Review and reiteration of good sleep hygiene measures should be pursued with any patient. The patient can follow up  with his referring provider, who will be notified of the test results. An appointment in sleep clinic can be made as necessary.   I certify that I have reviewed the raw data recording prior to the issuance of this report in accordance with the standards of the American Academy of Sleep Medicine (AASM).   INTERPRETING PHYSICIAN:  [name], MD, PhD  Board Certified in Neurology and Sleep Medicine  [facility] [address], [address] [place], [place] [address] [phone]

## 2020-10-18 NOTE — Progress Notes (Signed)
Please note that patient needs a sign language interpreter or appropriate phone interpreter when talking on the phone.  Patient referred by [name], PA, seen by me on 09/05/20, HST on 10/10/20.   Please call and notify the patient that the recent home sleep test did not show any significant obstructive sleep apnea, overall AHI was 3.9/h.  A few brief desaturations were noted.  Snoring was noted and appeared to be in the mild to moderate range.  For disturbing snoring, a dental device may be considered.  Positive airway pressure treatment with a CPAP or AutoPap machine are not warranted.  Weight loss and avoiding the supine sleep position may aid in reducing snoring.  If he would like to explore a dental device through a dentist, he can talk to his own dentist or we can make a referral.  Please keep in mind that for snoring only, a dental device may not be covered by the insurance.   At this juncture, he can follow-up with his primary care providers. Thanks,  [name], MD, PhD [facility])

## 2020-10-19 ENCOUNTER — Other Ambulatory Visit: Payer: Self-pay | Admitting: Physician Assistant

## 2020-10-19 DIAGNOSIS — J4530 Mild persistent asthma, uncomplicated: Secondary | ICD-10-CM

## 2020-12-13 ENCOUNTER — Telehealth: Payer: Self-pay

## 2020-12-13 NOTE — Telephone Encounter (Signed)
Patient wanting to know if he is a good candidate for the 4th COVID shot

## 2020-12-14 NOTE — Telephone Encounter (Signed)
Left message on voicemail to call office.  

## 2020-12-14 NOTE — Telephone Encounter (Signed)
He does not need a second booster dose.  This is indicated for patients older than 50 or with immunocompromised conditions.  Katina Degree. Jimmey Ralph, MD 12/14/2020 8:56 AM

## 2020-12-15 NOTE — Telephone Encounter (Signed)
Left message on voicemail to call office.  

## 2020-12-16 NOTE — Telephone Encounter (Signed)
Attempted to inform patient, voice message not set up.

## 2020-12-20 NOTE — Telephone Encounter (Signed)
Patient has been informed message below regarding booster patient voiced understanding

## 2020-12-20 NOTE — Telephone Encounter (Signed)
Noted  

## 2021-04-03 ENCOUNTER — Encounter: Payer: Self-pay | Admitting: Physician Assistant

## 2021-04-03 ENCOUNTER — Telehealth: Payer: Self-pay

## 2021-04-03 NOTE — Telephone Encounter (Signed)
Tammy, did you mean 9/24 for Flu shot, 10/24 is not here yet.

## 2021-04-03 NOTE — Telephone Encounter (Signed)
03/25/2021 

## 2021-04-03 NOTE — Telephone Encounter (Signed)
Already updated

## 2021-04-03 NOTE — Telephone Encounter (Signed)
Patient called to update chart:  Received flu vac on the 10/24.  Would like to update in his chart.

## 2021-04-03 NOTE — Telephone Encounter (Signed)
Patient received flu vac at [facility] on [address].

## 2021-05-08 ENCOUNTER — Other Ambulatory Visit: Payer: Self-pay | Admitting: Physician Assistant

## 2021-05-08 DIAGNOSIS — M546 Pain in thoracic spine: Secondary | ICD-10-CM

## 2021-05-08 DIAGNOSIS — G8929 Other chronic pain: Secondary | ICD-10-CM

## 2021-06-05 ENCOUNTER — Encounter: Payer: Self-pay | Admitting: Physician Assistant

## 2021-06-05 ENCOUNTER — Other Ambulatory Visit: Payer: Self-pay

## 2021-06-05 ENCOUNTER — Ambulatory Visit: Payer: BC Managed Care – PPO | Admitting: Physician Assistant

## 2021-06-05 VITALS — BP 153/94 | HR 88 | Temp 98.3°F | Ht 66.0 in | Wt 190.6 lb

## 2021-06-05 DIAGNOSIS — M25571 Pain in right ankle and joints of right foot: Secondary | ICD-10-CM | POA: Diagnosis not present

## 2021-06-05 NOTE — Progress Notes (Signed)
Thomas [name] is a 40 y.o. male here for right ankle pain.   History of Present Illness:   Chief Complaint  Patient presents with   Ankle Pain    Pt c/o right ankle pain; pt stating rolled ankle 3 times at work on the same day, had swelling last Friday and was told to go home by employer. Took tramadol Friday and Saturday; now tender and pops when walking   Thomas [name] agreed to using an ASL interpreter during today's visit, [name].   HPI  Right Ankle Pain Thomas [name] presents with c/o right ankle pian that has been onset for about 4 days. Pt reports he rolled his ankle inward three times at work on the same day while unloading a truck. States that the second time he rolled it, he took some tylenol which provided minor relief. Upon returning to work that Friday, he was told by his employer to go home due to his difficulty walking. Thomas [name] says he feels a clicking sensation when he walks as well as swelling and tenderness. In an effort to relieve his pain, he took a tramadol on Friday and Saturday which provided minor relief. He also wore an ankle brace, compression socks, and soaked his ankle in a hot water and salt bath. Over the past couple of days, he noticed the swelling had improved but wanted to know if further damage was done.   Past Medical History:  Diagnosis Date   Chronic back pain    Deaf      Social History   Tobacco Use   Smoking status: Never   Smokeless tobacco: Never  Substance Use Topics   Alcohol use: No   Drug use: No    History reviewed. No pertinent surgical history.  Family History  Problem Relation Age of Onset   Mental illness Mother    Cancer Maternal Grandfather        brain cancer   Diabetes Paternal Grandmother    Hypertension Paternal Grandmother    Stroke Paternal Grandmother    Prostate cancer Neg Hx    Colon cancer Neg Hx     No Known Allergies  Current Medications:   Current Outpatient Medications:    ADVAIR DISKUS 100-50 MCG/DOSE  AEPB, INHALE 1 PUFF INTO THE LUNGS IN THE MORNING AND AT BEDTIME., Disp: 180 each, Rfl: 2   albuterol (PROVENTIL HFA;VENTOLIN HFA) 108 (90 Base) MCG/ACT inhaler, Inhale 2 puffs into the lungs every 6 (six) hours as needed for wheezing or shortness of breath., Disp: 1 Inhaler, Rfl: 0   amLODipine (NORVASC) 5 MG tablet, Take 1 tablet (5 mg total) by mouth daily., Disp: 90 tablet, Rfl: 1   simvastatin (ZOCOR) 40 MG tablet, Take 1 tablet (40 mg total) by mouth at bedtime., Disp: 90 tablet, Rfl: 3   traMADol (ULTRAM) 50 MG tablet, TAKE 1 TABLET (50 MG TOTAL) BY MOUTH EVERY 8 (EIGHT) HOURS AS NEEDED. FOR CHRONIC PAIN, Disp: 30 tablet, Rfl: 0   Review of Systems:   ROS Negative unless otherwise specified per HPI.  Vitals:   Vitals:   06/05/21 1353  BP: (!) 153/94  Pulse: 88  Temp: 98.3 F (36.8 C)  TempSrc: Temporal  SpO2: 99%  Weight: 190 lb 9.6 oz (86.5 kg)  Height: 5\' 6"  (1.676 m)     Body mass index is 30.76 kg/m.  Physical Exam:   Physical Exam Vitals and nursing note reviewed.  Constitutional:      Appearance: He is well-developed.  HENT:  Head: Normocephalic.  Eyes:     Conjunctiva/sclera: Conjunctivae normal.     Pupils: Pupils are equal, round, and reactive to light.  Pulmonary:     Effort: Pulmonary effort is normal.  Musculoskeletal:        General: Normal range of motion.     Cervical back: Normal range of motion.     Right ankle: Tenderness present.     Left ankle: Normal.     Comments: R ankle No obvious swelling Normal ROM Slight TTP to posterior aspect of lateral malleolus  Skin:    General: Skin is warm and dry.  Neurological:     Mental Status: He is alert and oriented to person, place, and time.  Psychiatric:        Behavior: Behavior normal.        Thought Content: Thought content normal.        Judgment: Judgment normal.    Assessment and Plan:   Acute right ankle pain Keep foot elevated, use ice as needed, and wear ankle brace if  planning on walk around their home Will send referral to Sports Medicine for further evaluation -- appt secured for next Monday 06/12/21 May take advil per package instructions to reduce pain/swelling. Advised as follows: If worsening pain in the meantime, please send me a mychart message and I will refer you to sports medicine sooner   I,[name],acting as a scribe for [name], PA.,have documented all relevant documentation on the behalf of [name], PA,as directed by  [name], PA while in the presence of [name], [place].  I, [name], [place], have reviewed all documentation for this visit. The documentation on 06/05/21 for the exam, diagnosis, procedures, and orders are all accurate and complete.   [name], PA-C

## 2021-06-05 NOTE — Patient Instructions (Signed)
It was great to see you!  For your ankle sprain -- keep your foot elevated, use ice as needed, and if you plan to do more than walk around your home, please use an ankle brace.  May take advil per package instructions to reduce pain/swelling.  If worsening pain in the meantime, please send me a mychart message and I will refer you to sports medicine sooner (they generally have same day availability)  Take care,  [name] PA-C

## 2021-06-07 NOTE — Progress Notes (Signed)
    Subjective:    CC: R ankle pain  I, Molly Weber, LAT, ATC, am serving as scribe for Dr. Clementeen Graham.  HPI: Pt is a 39 y/o male presenting w/ c/o R ankle pain since 06/02/21 when he rolled his ankle x 3 while at work.  He locates his pain to his R lateral ankle and proximal lateral foot.  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  R ankle swelling: intermittently yes R ankle mechanical symptoms: yes Aggravating factors: walking/weight-bearing activity; prolonged standing Treatments tried: Tylenol; Tramadol; ankle brace; compression socks; ice; IBU. He also took one of his's wife's meloxicam and it helped a lot  Pertinent review of Systems: No fevers or chills  Relevant historical information: Hypertension. Deaf   Objective:    Vitals:   06/12/21 0800  BP: (!) 148/98  Pulse: 89  SpO2: 95%   General: Well Developed, well nourished, and in no acute distress.   MSK: Right ankle mild swelling at ATFL region. Normal-appearing otherwise. Normal ankle motion. Tender palpation ATFL. Stable ligamentous exam. Intact strength. Pulses cap refill and sensation are intact distally.  Lab and Radiology Results  Diagnostic Limited MSK Ultrasound of: Right ankle Swelling present at ankle joint at ATFL region. Peroneal tendon is intact without visible tear.  Mild hypoechoic fluid surrounds tendon at tendon sheath. Bony structures are intact Impression: Ankle sprain   X-ray images right ankle obtained today personally and independently interpreted No acute fractures. Await formal radiology review    Impression and Recommendations:    Assessment and Plan: 40 y.o. male with right lateral ankle pain after inversion injury occurring about 10 days ago.  Pain consistent with ankle sprain.  Plan for home exercise program taught in clinic today by ATC and meloxicam.  Also recommend ASO brace and ice.  Check back in 1 month.  If not improving he will contact me  sooner and I will order physical therapy.  Today's visit conducted using an ASL interpreter.Marland Kitchen  PDMP not reviewed this encounter. Orders Placed This Encounter  Procedures   [place] LIMITED JOINT SPACE STRUCTURES LOW RIGHT(NO LINKED CHARGES)    Order Specific Question:   Reason for Exam (SYMPTOM  OR DIAGNOSIS REQUIRED)    Answer:   R ankle pain    Order Specific Question:   Preferred imaging location?    Answer:   [facility] Ankle Complete Right    Standing Status:   Future    Standing Expiration Date:   07/08/2021    Order Specific Question:   Reason for Exam (SYMPTOM  OR DIAGNOSIS REQUIRED)    Answer:   R ankle pain    Order Specific Question:   Preferred imaging location?    Answer:   [facility]   Meds ordered this encounter  Medications   meloxicam (MOBIC) 15 MG tablet    Sig: Take 1 tablet (15 mg total) by mouth daily as needed for pain.    Dispense:  30 tablet    Refill:  0    Discussed warning signs or symptoms. Please see discharge instructions. Patient expresses understanding.   The above documentation has been reviewed and is accurate and complete Clementeen Graham, M.D.

## 2021-06-12 ENCOUNTER — Other Ambulatory Visit: Payer: Self-pay

## 2021-06-12 ENCOUNTER — Ambulatory Visit (INDEPENDENT_AMBULATORY_CARE_PROVIDER_SITE_OTHER): Payer: BC Managed Care – PPO

## 2021-06-12 ENCOUNTER — Ambulatory Visit (INDEPENDENT_AMBULATORY_CARE_PROVIDER_SITE_OTHER): Payer: BC Managed Care – PPO | Admitting: Family Medicine

## 2021-06-12 ENCOUNTER — Ambulatory Visit: Payer: Self-pay

## 2021-06-12 ENCOUNTER — Encounter: Payer: Self-pay | Admitting: Family Medicine

## 2021-06-12 VITALS — BP 148/98 | HR 89 | Ht 66.0 in | Wt 189.6 lb

## 2021-06-12 DIAGNOSIS — M25571 Pain in right ankle and joints of right foot: Secondary | ICD-10-CM | POA: Diagnosis not present

## 2021-06-12 MED ORDER — MELOXICAM 15 MG PO TABS: 15.0000 mg | ORAL_TABLET | Freq: Every day | ORAL | 0 refills | Status: DC | PRN

## 2021-06-12 NOTE — Patient Instructions (Addendum)
Nice to meet you.  Please perform the exercise program that we have prepared for you and gone over in detail on a daily basis.  In addition to the handout you were provided you can access your program through: [email or web address]   Your unique program code is:   [email or web address]  Please get an Xray today before you leave.  Wear the ankle brace when you're being active and walking a lot.  Please go to [facility] supply to get the ankle brace we talked about today. You may also be able to get it from [employer].   Med Spec ASO Ankle Stabilizer  Follow-up: one month

## 2021-06-13 NOTE — Progress Notes (Signed)
Ankle x-ray looks normal to radiology

## 2021-07-07 NOTE — Progress Notes (Signed)
° °  I, Wendy Poet, LAT, ATC, am serving as scribe for Dr. Lynne Leader.  Thomas [name] is a 41 y.o. male who presents to [place] [facility] today for f/u of R lateral ankle and foot pain due to a lateral ankle sprain that occurred on 06/02/21.  He was last seen by Dr. Georgina Snell on 06/12/21 and was shown a HEP focusing on R ankle strengthening and balance.  He was also advised to purchase an ankle brace.  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  Today, pt reports R ankle is pretty much all better. Pt's work has changed somewhat and is wearing different shoes, which has been helpful. Pt is not taking the meloxicam anymore and has been wearing the ankle brace.   Diagnostic testing: R ankle XR- 06/12/21  Pertinent review of systems: No fevers or chills  Relevant historical information: Hypertension.  Deaf.   Exam:  BP (!) 158/102    Pulse 85    Ht 5\' 6"  (1.676 m)    Wt 194 lb 6.4 oz (88.2 kg)    SpO2 98%    BMI 31.38 kg/m  General: Well Developed, well nourished, and in no acute distress.   MSK: Right ankle normal-appearing nontender normal motion stable to commence exam intact strength.    Lab and Radiology Results EXAM: RIGHT ANKLE - COMPLETE 3+ VIEW   COMPARISON:  None.   FINDINGS: There is no evidence of fracture, dislocation, or joint effusion. There is no evidence of arthropathy or other focal bone abnormality. Soft tissues are unremarkable.   IMPRESSION: Negative.     Electronically Signed   By: Anner Crete M.D.   On: 06/12/2021 20:32 I, Lynne Leader, personally (independently) visualized and performed the interpretation of the images attached in this note.      Assessment and Plan: 41 y.o. male with right ankle pain due to ankle inversion injury and sprain.  Significantly improved with time and home exercise program.  Watchful waiting at this point and recheck back as needed.  Precautions reviewed. Total  encounter time 20 minutes including face-to-face time with the patient and, reviewing past medical record, and charting on the date of service.   Treatment plan and options as well as backup plan..  Visit conducted using an American sign language interpreter.     Discussed warning signs or symptoms. Please see discharge instructions. Patient expresses understanding.   The above documentation has been reviewed and is accurate and complete Lynne Leader, M.D.

## 2021-07-10 ENCOUNTER — Other Ambulatory Visit: Payer: Self-pay | Admitting: Family Medicine

## 2021-07-10 ENCOUNTER — Ambulatory Visit (INDEPENDENT_AMBULATORY_CARE_PROVIDER_SITE_OTHER): Payer: BC Managed Care – PPO | Admitting: Family Medicine

## 2021-07-10 ENCOUNTER — Other Ambulatory Visit: Payer: Self-pay

## 2021-07-10 VITALS — BP 158/102 | HR 85 | Ht 66.0 in | Wt 194.4 lb

## 2021-07-10 DIAGNOSIS — S93491D Sprain of other ligament of right ankle, subsequent encounter: Secondary | ICD-10-CM | POA: Diagnosis not present

## 2021-07-10 NOTE — Patient Instructions (Signed)
Thank you for coming in today.  ? ?Glad you are doing well! ? ?Recheck back as needed ?

## 2021-08-02 ENCOUNTER — Other Ambulatory Visit: Payer: Self-pay | Admitting: Physician Assistant

## 2021-08-02 DIAGNOSIS — J4530 Mild persistent asthma, uncomplicated: Secondary | ICD-10-CM

## 2021-08-14 ENCOUNTER — Encounter: Payer: Self-pay | Admitting: Physician Assistant

## 2021-08-14 ENCOUNTER — Other Ambulatory Visit: Payer: Self-pay | Admitting: Physician Assistant

## 2021-08-14 ENCOUNTER — Other Ambulatory Visit: Payer: Self-pay | Admitting: Family Medicine

## 2021-08-14 DIAGNOSIS — I10 Essential (primary) hypertension: Secondary | ICD-10-CM

## 2021-08-14 DIAGNOSIS — G8929 Other chronic pain: Secondary | ICD-10-CM

## 2021-08-14 MED ORDER — TRAMADOL HCL 50 MG PO TABS: 50.0000 mg | ORAL_TABLET | Freq: Three times a day (TID) | ORAL | 0 refills | Status: DC | PRN

## 2021-08-14 MED ORDER — SIMVASTATIN 40 MG PO TABS: ORAL_TABLET | ORAL | 0 refills | Status: DC

## 2021-08-14 MED ORDER — AMLODIPINE BESYLATE 5 MG PO TABS: 5.0000 mg | ORAL_TABLET | Freq: Every day | ORAL | 0 refills | Status: DC

## 2021-08-14 NOTE — Telephone Encounter (Signed)
Pt requesting refill for Tramadol. Last OV 06/2021.

## 2021-08-15 ENCOUNTER — Other Ambulatory Visit: Payer: Self-pay | Admitting: Family Medicine

## 2021-08-22 ENCOUNTER — Telehealth: Payer: Self-pay | Admitting: *Deleted

## 2021-08-22 NOTE — Telephone Encounter (Signed)
Key: [id number] - PA Case ID: [id number] - Rx #: [id number] Status Sent to Plan today Drug traMADol HCl 50MG  tablets Waiting for determination

## 2021-08-22 NOTE — Telephone Encounter (Signed)
Approvedtoday Your PA request has been approved. Additional information will be provided in the approval communication.  Pharmacy notified

## 2021-09-12 ENCOUNTER — Other Ambulatory Visit: Payer: Self-pay | Admitting: Family Medicine

## 2021-10-18 ENCOUNTER — Other Ambulatory Visit: Payer: Self-pay | Admitting: Family Medicine

## 2021-11-11 ENCOUNTER — Other Ambulatory Visit: Payer: Self-pay | Admitting: Physician Assistant

## 2021-11-11 DIAGNOSIS — I10 Essential (primary) hypertension: Secondary | ICD-10-CM

## 2021-11-22 ENCOUNTER — Other Ambulatory Visit: Payer: Self-pay | Admitting: Family Medicine

## 2021-12-25 ENCOUNTER — Other Ambulatory Visit: Payer: Self-pay | Admitting: Family Medicine

## 2022-01-02 ENCOUNTER — Encounter: Payer: Self-pay | Admitting: Physician Assistant

## 2022-01-03 ENCOUNTER — Encounter (HOSPITAL_BASED_OUTPATIENT_CLINIC_OR_DEPARTMENT_OTHER): Payer: Self-pay | Admitting: Emergency Medicine

## 2022-01-03 ENCOUNTER — Emergency Department (HOSPITAL_BASED_OUTPATIENT_CLINIC_OR_DEPARTMENT_OTHER)
Admission: EM | Admit: 2022-01-03 | Discharge: 2022-01-03 | Disposition: A | Discharge status: Home / Self Care | Payer: BC Managed Care – PPO | Attending: Emergency Medicine | Admitting: Emergency Medicine

## 2022-01-03 ENCOUNTER — Emergency Department (HOSPITAL_BASED_OUTPATIENT_CLINIC_OR_DEPARTMENT_OTHER): Payer: BC Managed Care – PPO

## 2022-01-03 ENCOUNTER — Other Ambulatory Visit: Payer: Self-pay

## 2022-01-03 DIAGNOSIS — R109 Unspecified abdominal pain: Secondary | ICD-10-CM | POA: Diagnosis present

## 2022-01-03 DIAGNOSIS — Z79899 Other long term (current) drug therapy: Secondary | ICD-10-CM | POA: Insufficient documentation

## 2022-01-03 DIAGNOSIS — N1330 Unspecified hydronephrosis: Secondary | ICD-10-CM | POA: Insufficient documentation

## 2022-01-03 DIAGNOSIS — N200 Calculus of kidney: Secondary | ICD-10-CM | POA: Diagnosis not present

## 2022-01-03 DIAGNOSIS — I10 Essential (primary) hypertension: Secondary | ICD-10-CM | POA: Insufficient documentation

## 2022-01-03 LAB — COMPREHENSIVE METABOLIC PANEL
ALT: 21 U/L (ref 0–44)
AST: 15 U/L (ref 15–41)
Albumin: 4.5 g/dL (ref 3.5–5.0)
Alkaline Phosphatase: 121 U/L (ref 38–126)
Anion gap: 13 (ref 5–15)
BUN: 14 mg/dL (ref 6–20)
CO2: 29 mmol/L (ref 22–32)
Calcium: 9.7 mg/dL (ref 8.9–10.3)
Chloride: 96 mmol/L — ABNORMAL LOW (ref 98–111)
Creatinine, Ser: 1.23 mg/dL (ref 0.61–1.24)
GFR, Estimated: >60 mL/min (ref >60)
Glucose, Bld: 118 mg/dL — ABNORMAL HIGH (ref 70–99)
Potassium: 3.7 mmol/L (ref 3.5–5.1)
Sodium: 138 mmol/L (ref 135–145)
Total Bilirubin: 0.4 mg/dL (ref 0.3–1.2)
Total Protein: 7.3 g/dL (ref 6.5–8.1)

## 2022-01-03 LAB — URINALYSIS, ROUTINE W REFLEX MICROSCOPIC
Bilirubin Urine: NEGATIVE
Glucose, UA: NEGATIVE mg/dL
Ketones, ur: NEGATIVE mg/dL
Leukocytes,Ua: NEGATIVE
Nitrite: NEGATIVE
Protein, ur: NEGATIVE mg/dL
Specific Gravity, Urine: <1.005 — ABNORMAL LOW (ref 1.005–1.030)
pH: 6.5 (ref 5.0–8.0)

## 2022-01-03 LAB — CBC WITH DIFFERENTIAL/PLATELET
Abs Immature Granulocytes: 0.08 10*3/uL — ABNORMAL HIGH (ref 0.00–0.07)
Basophils Absolute: 0.1 10*3/uL (ref 0.0–0.1)
Basophils Relative: 0 %
Eosinophils Absolute: 0 10*3/uL (ref 0.0–0.5)
Eosinophils Relative: 0 %
HCT: 41.5 % (ref 39.0–52.0)
Hemoglobin: 14.3 g/dL (ref 13.0–17.0)
Immature Granulocytes: 1 %
Lymphocytes Relative: 12 %
Lymphs Abs: 1.9 10*3/uL (ref 0.7–4.0)
MCH: 28.6 pg (ref 26.0–34.0)
MCHC: 34.5 g/dL (ref 30.0–36.0)
MCV: 83 fL (ref 80.0–100.0)
Monocytes Absolute: 0.9 10*3/uL (ref 0.1–1.0)
Monocytes Relative: 6 %
Neutro Abs: 13.1 10*3/uL — ABNORMAL HIGH (ref 1.7–7.7)
Neutrophils Relative %: 81 %
Platelets: 322 10*3/uL (ref 150–400)
RBC: 5 MIL/uL (ref 4.22–5.81)
RDW: 12.4 % (ref 11.5–15.5)
WBC: 16.1 10*3/uL — ABNORMAL HIGH (ref 4.0–10.5)
nRBC: 0 % (ref 0.0–0.2)

## 2022-01-03 LAB — LIPASE, BLOOD: Lipase: 20 U/L (ref 11–51)

## 2022-01-03 MED ORDER — ONDANSETRON HCL 4 MG/2ML IJ SOLN
4.0000 mg | Freq: Once | INTRAMUSCULAR | Status: AC
  Administered 2022-01-03: 4 mg via INTRAVENOUS
  Filled 2022-01-03: qty 2

## 2022-01-03 MED ORDER — TAMSULOSIN HCL 0.4 MG PO CAPS: 0.4000 mg | ORAL_CAPSULE | Freq: Every day | ORAL | 0 refills | Status: DC

## 2022-01-03 MED ORDER — TAMSULOSIN HCL 0.4 MG PO CAPS
0.4000 mg | ORAL_CAPSULE | Freq: Once | ORAL | Status: AC
  Administered 2022-01-03: 0.4 mg via ORAL
  Filled 2022-01-03: qty 1

## 2022-01-03 MED ORDER — IBUPROFEN 400 MG PO TABS: 400.0000 mg | ORAL_TABLET | Freq: Three times a day (TID) | ORAL | 0 refills | Status: DC | PRN

## 2022-01-03 MED ORDER — SODIUM CHLORIDE 0.9 % IV BOLUS
1000.0000 mL | Freq: Once | INTRAVENOUS | Status: AC
  Administered 2022-01-03: 1000 mL via INTRAVENOUS

## 2022-01-03 MED ORDER — ONDANSETRON 4 MG PO TBDP: 4.0000 mg | ORAL_TABLET | ORAL | 0 refills | Status: DC | PRN

## 2022-01-03 MED ORDER — HYDROCODONE-ACETAMINOPHEN 5-325 MG PO TABS
1.0000 | ORAL_TABLET | Freq: Four times a day (QID) | ORAL | 0 refills | Status: DC | PRN
Start: 2022-01-03 — End: 2022-03-13

## 2022-01-03 MED ORDER — HYDROMORPHONE HCL 1 MG/ML IJ SOLN
0.5000 mg | Freq: Once | INTRAMUSCULAR | Status: AC
  Administered 2022-01-03: 0.5 mg via INTRAVENOUS
  Filled 2022-01-03: qty 1

## 2022-01-03 NOTE — ED Provider Notes (Signed)
[facility] EMERGENCY DEPT Provider Note   CSN: [id number] Arrival date & time: 01/03/22  8413     History  Chief Complaint  Patient presents with   Flank Pain    Thomas [name] is a 41 y.o. male.  HPI Patient reports he has a history of kidney stones with an episode about 10 years ago.  Other medical history is for hypertension and hyperlipidemia.  Patient reports about 6 PM yesterday he started getting a lot of pain in his left flank.  He reports its been a constant throbbing and aching pain.  It started quite abruptly.  No radiation of pain.  He reports he did notice maybe a few specks of blood in his urine.  He has not had pain burning urgency with urination.  No fevers no chills no vomiting.  No diarrhea no constipation.  He did try some Tylenol and got mild improvement.    Home Medications Prior to Admission medications   Medication Sig Start Date End Date Taking? Authorizing Provider  HYDROcodone-acetaminophen (NORCO/VICODIN) 5-325 MG tablet Take 1-2 tablets by mouth every 6 (six) hours as needed for moderate pain or severe pain. 01/03/22  Yes Arby Barrette, MD  ibuprofen (ADVIL) 400 MG tablet Take 1 tablet (400 mg total) by mouth every 8 (eight) hours as needed for moderate pain. 01/03/22  Yes Arby Barrette, MD  ondansetron (ZOFRAN-ODT) 4 MG disintegrating tablet Take 1 tablet (4 mg total) by mouth every 4 (four) hours as needed for nausea or vomiting. 01/03/22  Yes Arby Barrette, MD  tamsulosin (FLOMAX) 0.4 MG CAPS capsule Take 1 capsule (0.4 mg total) by mouth daily. 01/03/22  Yes Arby Barrette, MD  ADVAIR DISKUS 100-50 MCG/ACT AEPB INHALE 1 PUFF INTO THE LUNGS IN THE MORNING AND AT BEDTIME. 08/02/21   [name], PA  albuterol (PROVENTIL HFA;VENTOLIN HFA) 108 (90 Base) MCG/ACT inhaler Inhale 2 puffs into the lungs every 6 (six) hours as needed for wheezing or shortness of breath. 08/29/18   [name], PA  amLODipine (NORVASC) 5 MG tablet  TAKE 1 TABLET (5 MG TOTAL) BY MOUTH DAILY. 11/13/21   [name], PA  meloxicam (MOBIC) 15 MG tablet TAKE 1 TABLET BY MOUTH EVERY DAY AS NEEDED FOR PAIN 12/25/21   Rodolph Bong, MD  simvastatin (ZOCOR) 40 MG tablet TAKE 1 TABLET BY MOUTH EVERYDAY AT BEDTIME 08/14/21   [name], PA  traMADol (ULTRAM) 50 MG tablet Take 1 tablet (50 mg total) by mouth every 8 (eight) hours as needed. FOR CHRONIC PAIN 08/14/21   [name], PA      Allergies    Patient has no known allergies.    Review of Systems   Review of Systems 10 systems reviewed negative except as per HPI Physical Exam Updated Vital Signs BP (!) 137/95   Pulse 80   Temp 98.2 F (36.8 C) (Oral)   Resp 16   Ht 5\' 8"  (1.727 m)   Wt 85.3 kg   SpO2 98%   BMI 28.59 kg/m  Physical Exam Constitutional:      Appearance: Normal appearance.  Eyes:     Extraocular Movements: Extraocular movements intact.  Cardiovascular:     Rate and Rhythm: Normal rate and regular rhythm.  Pulmonary:     Effort: Pulmonary effort is normal.     Breath sounds: Normal breath sounds.  Abdominal:     General: There is no distension.     Palpations: Abdomen is soft.  Tenderness: There is no abdominal tenderness. There is no guarding.     Comments: No soft tissue abnormalities or percussion tenderness in the left flank  Musculoskeletal:        General: No swelling or tenderness. Normal range of motion.     Right lower leg: No edema.     Left lower leg: No edema.     Comments: Bilateral lower extremities are symmetric and normal in appearance without peripheral edema  Skin:    General: Skin is warm and dry.  Neurological:     General: No focal deficit present.     Mental Status: He is alert and oriented to person, place, and time.     Coordination: Coordination normal.     ED Results / Procedures / Treatments   Labs (all labs ordered are listed, but only abnormal results are displayed) Labs Reviewed  COMPREHENSIVE  METABOLIC PANEL - Abnormal; Notable for the following components:      Result Value   Chloride 96 (*)    Glucose, Bld 118 (*)    All other components within normal limits  CBC WITH DIFFERENTIAL/PLATELET - Abnormal; Notable for the following components:   WBC 16.1 (*)    Neutro Abs 13.1 (*)    Abs Immature Granulocytes 0.08 (*)    All other components within normal limits  URINALYSIS, ROUTINE W REFLEX MICROSCOPIC - Abnormal; Notable for the following components:   Specific Gravity, Urine <1.005 (*)    Hgb urine dipstick LARGE (*)    All other components within normal limits  LIPASE, BLOOD    EKG None  Radiology No results found.  Procedures Procedures    Medications Ordered in ED Medications  sodium chloride 0.9 % bolus 1,000 mL (0 mLs Intravenous Stopped 01/03/22 0933)  HYDROmorphone (DILAUDID) injection 0.5 mg (0.5 mg Intravenous Given 01/03/22 0749)  ondansetron (ZOFRAN) injection 4 mg (4 mg Intravenous Given 01/03/22 0749)  tamsulosin (FLOMAX) capsule 0.4 mg (0.4 mg Oral Given 01/03/22 2130)    ED Course/ Medical Decision Making/ A&P                           Medical Decision Making Amount and/or Complexity of Data Reviewed Labs: ordered. Radiology: ordered.  Risk Prescription drug management.   Patient presents with acute onset of left flank pain yesterday evening.  Pain has persisted throughout the night.  Patient is nontoxic clinically well in appearance.  Patient does have history of hypertension.  differential diagnosis includes kidney stone\aortic aneurysm\pyelonephritis\bowel obstruction.  We will proceed with CT abdomen pelvis and pain control with Dilaudid and hydration with normal saline.  Check: Patient has good pain control.  Nontoxic and well in appearance.  CT scan personally examined and reviewed by myself.  Radiology interpretation reviewed.  Patient has proximal left ureteral stone with hydronephrosis.  No other significant findings per  radiology.  Urinalysis does not show signs of infection.  Patient is well pain control.  At this time stable for discharge with pain control, tamsulosin and follow-up with urology.  Careful return precautions reviewed.        Final Clinical Impression(s) / ED Diagnoses Final diagnoses:  Kidney stone on left side  Hydronephrosis, left    Rx / DC Orders ED Discharge Orders          Ordered    tamsulosin (FLOMAX) 0.4 MG CAPS capsule  Daily        01/03/22 0918    ibuprofen (  ADVIL) 400 MG tablet  Every 8 hours PRN        01/03/22 0918    HYDROcodone-acetaminophen (NORCO/VICODIN) 5-325 MG tablet  Every 6 hours PRN        01/03/22 0918    ondansetron (ZOFRAN-ODT) 4 MG disintegrating tablet  Every 4 hours PRN        01/03/22 7628              Arby Barrette, MD 01/18/22 [phone]

## 2022-01-03 NOTE — ED Notes (Signed)
ED Provider at bedside. 

## 2022-01-03 NOTE — ED Triage Notes (Signed)
Pt arrives to ED with c/o left sided flank pain since yesterday. Associated symptoms include hematuria.

## 2022-01-03 NOTE — Discharge Instructions (Addendum)
1.  Strain your urine for the kidney stone. 2.  Make an appoint with [facility] for recheck as soon as possible. 3.  Take Flomax daily as prescribed.  For additional pain control you may take ibuprofen 400 mg every 8 hours.  Do not take this for more than 3 to 4 days.  You may take 1-2 Vicodin every 6 hours as prescribed for additional pain control.  If you do not need a pain medication stronger than Vicodin, you may add extra strength Tylenol every 6 hours. 4.  Return to the emergency department if you get fevers, worsening pain, general weakness or other concerning symptoms.

## 2022-01-03 NOTE — ED Notes (Signed)
Patient verbalizes understanding of discharge instructions. Opportunity for questioning and answers were provided. Patient discharged from ED.  °

## 2022-01-30 ENCOUNTER — Other Ambulatory Visit: Payer: Self-pay | Admitting: Family Medicine

## 2022-02-03 ENCOUNTER — Other Ambulatory Visit: Payer: Self-pay | Admitting: Physician Assistant

## 2022-02-03 DIAGNOSIS — J4530 Mild persistent asthma, uncomplicated: Secondary | ICD-10-CM

## 2022-02-16 ENCOUNTER — Other Ambulatory Visit: Payer: Self-pay | Admitting: Physician Assistant

## 2022-02-16 DIAGNOSIS — I10 Essential (primary) hypertension: Secondary | ICD-10-CM

## 2022-03-13 ENCOUNTER — Ambulatory Visit (INDEPENDENT_AMBULATORY_CARE_PROVIDER_SITE_OTHER): Payer: BC Managed Care – PPO | Admitting: Physician Assistant

## 2022-03-13 ENCOUNTER — Encounter: Payer: Self-pay | Admitting: Physician Assistant

## 2022-03-13 ENCOUNTER — Other Ambulatory Visit: Payer: Self-pay | Admitting: Physician Assistant

## 2022-03-13 VITALS — BP 124/80 | HR 93 | Temp 98.8°F | Ht 68.0 in | Wt 186.5 lb

## 2022-03-13 DIAGNOSIS — Z1159 Encounter for screening for other viral diseases: Secondary | ICD-10-CM

## 2022-03-13 DIAGNOSIS — Z0000 Encounter for general adult medical examination without abnormal findings: Secondary | ICD-10-CM

## 2022-03-13 DIAGNOSIS — I10 Essential (primary) hypertension: Secondary | ICD-10-CM

## 2022-03-13 DIAGNOSIS — Z23 Encounter for immunization: Secondary | ICD-10-CM

## 2022-03-13 DIAGNOSIS — E7800 Pure hypercholesterolemia, unspecified: Secondary | ICD-10-CM

## 2022-03-13 DIAGNOSIS — R41840 Attention and concentration deficit: Secondary | ICD-10-CM

## 2022-03-13 DIAGNOSIS — G8929 Other chronic pain: Secondary | ICD-10-CM

## 2022-03-13 DIAGNOSIS — M546 Pain in thoracic spine: Secondary | ICD-10-CM

## 2022-03-13 LAB — CBC WITH DIFFERENTIAL/PLATELET
Basophils Absolute: 0.1 10*3/uL (ref 0.0–0.1)
Basophils Relative: 0.7 % (ref 0.0–3.0)
Eosinophils Absolute: 0.2 10*3/uL (ref 0.0–0.7)
Eosinophils Relative: 1.9 % (ref 0.0–5.0)
HCT: 43.2 % (ref 39.0–52.0)
Hemoglobin: 14.7 g/dL (ref 13.0–17.0)
Lymphocytes Relative: 25.4 % (ref 12.0–46.0)
Lymphs Abs: 3 10*3/uL (ref 0.7–4.0)
MCHC: 34.1 g/dL (ref 30.0–36.0)
MCV: 85.9 fl (ref 78.0–100.0)
Monocytes Absolute: 0.9 10*3/uL (ref 0.1–1.0)
Monocytes Relative: 7.5 % (ref 3.0–12.0)
Neutro Abs: 7.6 10*3/uL (ref 1.4–7.7)
Neutrophils Relative %: 64.5 % (ref 43.0–77.0)
Platelets: 336 10*3/uL (ref 150.0–400.0)
RBC: 5.04 Mil/uL (ref 4.22–5.81)
RDW: 13.6 % (ref 11.5–15.5)
WBC: 11.8 10*3/uL — ABNORMAL HIGH (ref 4.0–10.5)

## 2022-03-13 MED ORDER — TRAMADOL HCL 50 MG PO TABS: 50.0000 mg | ORAL_TABLET | Freq: Three times a day (TID) | ORAL | 0 refills | Status: DC | PRN

## 2022-03-13 NOTE — Progress Notes (Signed)
Subjective:    Thomas [name] is a 41 y.o. male and is here for a comprehensive physical exam.  HPI  Health Maintenance Due  Topic Date Due   Hepatitis C Screening  Never done   He is here with in-person interpreter.  Acute Concerns: Concern for ADHD -- wife has concerns about ADHD. He has to write down things to remember them. Has a lot of unfinished projects. Denies anxiety/depression.  Chronic Issues: HTN Currently taking norvsac 5 mg daily. At home blood pressure readings are: not checked. Patient denies chest pain, SOB, blurred vision, dizziness, unusual headaches, lower leg swelling. Patient is compliant with medication. Denies excessive caffeine intake, stimulant usage, excessive alcohol intake, or increase in salt consumption.  BP Readings from Last 3 Encounters:  03/13/22 124/80  01/03/22 (!) 137/95  07/10/21 (!) 158/102   HLD Currently taking zocor 40 mg daily. Denies concerns.  Chronic neck/thoracic pain Hx of this. Takes mobic 15 mg regularly. Will take tramadol for breakthrough pain. Does a lot of overhead lifting at work.  Health Maintenance: Immunizations -- UTD Colonoscopy -- n/a PSA -- No results found for: "PSA1", "PSA" Diet -- overall healthy Sleep habits -- no major concerns Exercise -- very active job Weight -- Weight: 186 lb 8 oz (84.6 kg)  Weight history Wt Readings from Last 10 Encounters:  03/13/22 186 lb 8 oz (84.6 kg)  01/03/22 188 lb (85.3 kg)  07/10/21 194 lb 6.4 oz (88.2 kg)  06/12/21 189 lb 9.6 oz (86 kg)  06/05/21 190 lb 9.6 oz (86.5 kg)  09/05/20 189 lb (85.7 kg)  08/01/20 183 lb 6.1 oz (83.2 kg)  01/12/20 182 lb (82.6 kg)  07/10/19 180 lb (81.6 kg)  04/20/19 176 lb (79.8 kg)   Body mass index is 28.36 kg/m. Mood -- stable Tobacco use --  Tobacco Use: Low Risk  (03/13/2022)   Patient History    Smoking Tobacco Use: Never    Smokeless Tobacco Use: Never    Passive Exposure: Not on file    Alcohol use ---   reports no history of alcohol use.      03/13/2022    1:46 PM  Depression screen PHQ 2/9  Decreased Interest 0  Down, Depressed, Hopeless 0  PHQ - 2 Score 0     Other providers/specialists: Patient Care Team: [name], [place] as PCP - General (Physician Assistant)   PMHx, SurgHx, SocialHx, Medications, and Allergies were reviewed in the Visit Navigator and updated as appropriate.   Past Medical History:  Diagnosis Date   Chronic back pain    Deaf     History reviewed. No pertinent surgical history.   Family History  Problem Relation Age of Onset   Mental illness Mother    Cancer Maternal Grandfather        brain cancer   Diabetes Paternal Grandmother    Hypertension Paternal Grandmother    Stroke Paternal Grandmother    Prostate cancer Neg Hx    Colon cancer Neg Hx     Social History   Tobacco Use   Smoking status: Never   Smokeless tobacco: Never  Substance Use Topics   Alcohol use: No   Drug use: No    Review of Systems:   Review of Systems  Constitutional:  Negative for chills, fever, malaise/fatigue and weight loss.  HENT:  Negative for hearing loss, sinus pain and sore throat.   Respiratory:  Negative for cough and hemoptysis.   Cardiovascular:  Negative  for chest pain, palpitations, leg swelling and PND.  Gastrointestinal:  Negative for abdominal pain, constipation, diarrhea, heartburn, nausea and vomiting.  Genitourinary:  Negative for dysuria, frequency and urgency.  Musculoskeletal:  Negative for back pain, myalgias and neck pain.  Skin:  Negative for itching and rash.  Neurological:  Negative for dizziness, tingling, seizures and headaches.  Endo/Heme/Allergies:  Negative for polydipsia.  Psychiatric/Behavioral:  Negative for depression. The patient is not nervous/anxious.     Objective:   Vitals:   03/13/22 1346  BP: 124/80  Pulse: 93  Temp: 98.8 F (37.1 C)  SpO2: 96%   Body mass index is 28.36 kg/m.  General Appearance:   Alert, cooperative, no distress, appears stated age  Head:  Normocephalic, without obvious abnormality, atraumatic  Eyes:  PERRL, conjunctiva/corneas clear, EOM's intact, fundi benign, both eyes       Ears:  Normal TM's and external ear canals, both ears  Nose: Nares normal, septum midline, mucosa normal, no drainage    or sinus tenderness  Throat: Lips, mucosa, and tongue normal; teeth and gums normal  Neck: Supple, symmetrical, trachea midline, no adenopathy; thyroid:  No enlargement/tenderness/nodules; no carotit bruit or JVD  Back:   Symmetric, no curvature, ROM normal, no CVA tenderness  Lungs:   Clear to auscultation bilaterally, respirations unlabored  Chest wall:  No tenderness or deformity  Heart:  Regular rate and rhythm, S1 and S2 normal, no murmur, rub   or gallop  Abdomen:   Soft, non-tender, bowel sounds active all four quadrants, no masses, no organomegaly  Extremities: Extremities normal, atraumatic, no cyanosis or edema  Prostate: Not done.   Skin: Skin color, texture, turgor normal, no rashes or lesions  Lymph nodes: Cervical, supraclavicular, and axillary nodes normal  Neurologic: CNII-XII grossly intact. Normal strength, sensation and reflexes throughout    Assessment/Plan:   Routine physical examination Today patient counseled on age appropriate routine health concerns for screening and prevention, each reviewed and up to date or declined. Immunizations reviewed and up to date or declined. Labs ordered and reviewed. Risk factors for depression reviewed and negative. Hearing function and visual acuity are intact. ADLs screened and addressed as needed. Functional ability and level of safety reviewed and appropriate. Education, counseling and referrals performed based on assessed risks today. Patient provided with a copy of personalized plan for preventive services.  Essential hypertension Normotensive Continue amlodopine 5 mg daily Follow-up in 4m to 107yr, sooner if  concerns  Pure hypercholesterolemia Update lipid panel and make adjustments to simvastatin 40 mg daily as indicated  Encounter for screening for other viral diseases Update Hep C test  Chronic midline thoracic back pain No red flags I have refilled tramadol 50 mg for breakthrough pain I have also written a work note for some restrictions -- if this is insufficient documentation, I may need to refer to sports medicine  Attention Deficit Concern for possible underlying ADHD Recommend evaluation -- referral placed  Patient Counseling: [x]   Nutrition: Stressed importance of moderation in sodium/caffeine intake, saturated fat and cholesterol, caloric balance, sufficient intake of fresh fruits, vegetables, and fiber.  [x]   Stressed the importance of regular exercise.   []   Substance Abuse: Discussed cessation/primary prevention of tobacco, alcohol, or other drug use; driving or other dangerous activities under the influence; availability of treatment for abuse.   [x]   Injury prevention: Discussed safety belts, safety helmets, smoke detector, smoking near bedding or upholstery.   []   Sexuality: Discussed sexually transmitted diseases, partner selection, use  of condoms, avoidance of unintended pregnancy  and contraceptive alternatives.   [x]   Dental health: Discussed importance of regular tooth brushing, flossing, and dental visits.  [x]   Health maintenance and immunizations reviewed. Please refer to Health maintenance section.    , PA-C [facility]

## 2022-03-13 NOTE — Patient Instructions (Signed)
It was great to see you!  Referral for ADHD evaluation. They will call you!  Please go to the lab for blood work.   Our office will call you with your results unless you have chosen to receive results via MyChart.  If your blood work is normal we will follow-up each year for physicals and as scheduled for chronic medical problems.  If anything is abnormal we will treat accordingly and get you in for a follow-up.  Take care,  Lelon Mast

## 2022-03-14 ENCOUNTER — Encounter: Payer: Self-pay | Admitting: Physician Assistant

## 2022-03-14 LAB — COMPREHENSIVE METABOLIC PANEL
ALT: 27 U/L (ref 0–53)
AST: 21 U/L (ref 0–37)
Albumin: 4.5 g/dL (ref 3.5–5.2)
Alkaline Phosphatase: 135 U/L — ABNORMAL HIGH (ref 39–117)
BUN: 14 mg/dL (ref 6–23)
CO2: 30 mEq/L (ref 19–32)
Calcium: 9.7 mg/dL (ref 8.4–10.5)
Chloride: 100 mEq/L (ref 96–112)
Creatinine, Ser: 0.86 mg/dL (ref 0.40–1.50)
GFR: 107.76 mL/min (ref >60.00)
Glucose, Bld: 104 mg/dL — ABNORMAL HIGH (ref 70–99)
Potassium: 3.7 mEq/L (ref 3.5–5.1)
Sodium: 141 mEq/L (ref 135–145)
Total Bilirubin: 0.3 mg/dL (ref 0.2–1.2)
Total Protein: 7.6 g/dL (ref 6.0–8.3)

## 2022-03-14 LAB — LDL CHOLESTEROL, DIRECT: Direct LDL: 100 mg/dL

## 2022-03-14 LAB — LIPID PANEL
Cholesterol: 164 mg/dL (ref 0–200)
HDL: 42 mg/dL (ref >39.00)
NonHDL: 121.94
Total CHOL/HDL Ratio: 4
Triglycerides: 357 mg/dL — ABNORMAL HIGH (ref 0.0–149.0)
VLDL: 71.4 mg/dL — ABNORMAL HIGH (ref 0.0–40.0)

## 2022-03-14 LAB — HEPATITIS C ANTIBODY: Hepatitis C Ab: NONREACTIVE

## 2022-03-15 ENCOUNTER — Encounter: Payer: Self-pay | Admitting: *Deleted

## 2022-03-16 ENCOUNTER — Telehealth: Payer: Self-pay | Admitting: *Deleted

## 2022-03-16 NOTE — Telephone Encounter (Signed)
Received fax from pharmacy PA needed for Tramadol. PA done thru [employer]. Awaiting response.

## 2022-03-19 NOTE — Telephone Encounter (Signed)
Received response from [employer] PA for Tramadol your PA request has been approved.

## 2022-03-19 NOTE — Telephone Encounter (Signed)
Called [facility] and spoke to [facility] and told her Tramadol has been approved approved from 03/16/2022 to 09/12/2022. Tiffany verbalized understanding and said Rx was picked up on the 16 th. Told her okay.

## 2022-04-02 ENCOUNTER — Encounter: Payer: Self-pay | Admitting: Physician Assistant

## 2022-04-04 ENCOUNTER — Encounter: Payer: Self-pay | Admitting: Physician Assistant

## 2022-04-04 NOTE — Telephone Encounter (Signed)
Please see message from patient

## 2022-04-04 NOTE — Telephone Encounter (Signed)
Hello!   I scheduled an appointment with you on Wednesday October 11th for other thing but I'll tell you what's up with the whole dr note problem when I see you with an interpreter.   Now -  after a multiple conversation with several people, they want me to come back with a dr note with no restrictions. And they will work it out with me, and come to some kind of understanding from this new department and the supervisor.    If you have time today out of your busy schedule, can I swing by and pick that new note up? I'll tell you all about it in person next week and discuss more.   Thomas [name]

## 2022-04-11 ENCOUNTER — Encounter: Payer: Self-pay | Admitting: Physician Assistant

## 2022-04-11 ENCOUNTER — Ambulatory Visit (INDEPENDENT_AMBULATORY_CARE_PROVIDER_SITE_OTHER): Payer: BC Managed Care – PPO | Admitting: Physician Assistant

## 2022-04-11 VITALS — BP 130/86 | HR 86 | Temp 98.0°F | Ht 68.0 in | Wt 187.0 lb

## 2022-04-11 DIAGNOSIS — G8929 Other chronic pain: Secondary | ICD-10-CM | POA: Diagnosis not present

## 2022-04-11 DIAGNOSIS — J4530 Mild persistent asthma, uncomplicated: Secondary | ICD-10-CM | POA: Diagnosis not present

## 2022-04-11 DIAGNOSIS — M546 Pain in thoracic spine: Secondary | ICD-10-CM | POA: Diagnosis not present

## 2022-04-11 MED ORDER — FLUTICASONE-SALMETEROL 250-50 MCG/ACT IN AEPB: 1.0000 | INHALATION_SPRAY | Freq: Two times a day (BID) | RESPIRATORY_TRACT | 0 refills | Status: DC

## 2022-04-11 MED ORDER — METHYLPREDNISOLONE ACETATE 80 MG/ML IJ SUSP
80.0000 mg | Freq: Once | INTRAMUSCULAR | Status: AC
  Administered 2022-04-11: 80 mg via INTRAMUSCULAR

## 2022-04-11 NOTE — Progress Notes (Signed)
Thomas [name] is a 41 y.o. male here for a follow up of a pre-existing problem.  History of Present Illness:   Chief Complaint  Patient presents with   Wheezing    Pt c/o chest congestion and wheezing started last week. Using inhaler but does not seem like it is enough.    HPI  Patient is in today for an office visit.  Patient was accompanied by a sign language interpreter, [name].  Mild reactive airway disease Patient is complaining of wheezing and reports that inhaler does not help to manage symptoms especially when seasons change. He is requesting a steroid injection to relieve symptoms. He tends to use inhaler before or after dinner. Last week he started allergy medication, Allegra. He is compliant with his Advair 100 mcg inhaler.   Chronic midline thoracic back pain Currently has restrictions at work to avoid heavy lifting. After several conversations with his employer, he reports that he is now in a temporary role that has allowed him to limit significant lifting. He is currently managing well and unsure of what will occur after this temporary assignment is over.   Past Medical History:  Diagnosis Date   Chronic back pain    Deaf    Hypertension      Social History   Tobacco Use   Smoking status: Never   Smokeless tobacco: Never  Substance Use Topics   Alcohol use: No   Drug use: No    History reviewed. No pertinent surgical history.  Family History  Problem Relation Age of Onset   Mental illness Mother    Diabetes Mother    Other Father 76       [place]   Diabetes Maternal Grandmother    Stroke Maternal Grandmother    Hypertension Maternal Grandmother    Cancer Maternal Grandfather        brain cancer   Other Paternal Grandfather        factory explosion   Prostate cancer Neg Hx    Colon cancer Neg Hx     No Known Allergies  Current Medications:   Current Outpatient Medications:     albuterol (PROVENTIL HFA;VENTOLIN HFA) 108 (90 Base) MCG/ACT inhaler, Inhale 2 puffs into the lungs every 6 (six) hours as needed for wheezing or shortness of breath., Disp: 1 Inhaler, Rfl: 0   amLODipine (NORVASC) 5 MG tablet, TAKE 1 TABLET (5 MG TOTAL) BY MOUTH DAILY., Disp: 90 tablet, Rfl: 0   fluticasone-salmeterol (ADVAIR DISKUS) 250-50 MCG/ACT AEPB, Inhale 1 puff into the lungs in the morning and at bedtime., Disp: 120 each, Rfl: 0   ibuprofen (ADVIL) 400 MG tablet, Take 1 tablet (400 mg total) by mouth every 8 (eight) hours as needed for moderate pain., Disp: 30 tablet, Rfl: 0   meloxicam (MOBIC) 15 MG tablet, TAKE 1 TABLET BY MOUTH EVERY DAY AS NEEDED FOR PAIN, Disp: 30 tablet, Rfl: 0   simvastatin (ZOCOR) 40 MG tablet, TAKE 1 TABLET BY MOUTH EVERYDAY AT BEDTIME, Disp: 90 tablet, Rfl: 0   traMADol (ULTRAM) 50 MG tablet, Take 1 tablet (50 mg total) by mouth every 8 (eight) hours as needed. FOR CHRONIC PAIN, Disp: 30 tablet, Rfl: 0   Review of Systems:   Review of Systems  Respiratory:  Positive for wheezing.     Vitals:   Vitals:   04/11/22 0823  BP: 130/86  Pulse: 86  Temp: 98 F (36.7 C)  TempSrc: Temporal  SpO2: 96%  Weight: 187 lb (84.8 kg)  Height: 5\' 8"  (1.727 m)     Body mass index is 28.43 kg/m.  Physical Exam:   Physical Exam Constitutional:      General: He is not in acute distress.    Appearance: Normal appearance. He is not ill-appearing.  HENT:     Head: Normocephalic and atraumatic.     Right Ear: External ear normal.     Left Ear: External ear normal.  Eyes:     Extraocular Movements: Extraocular movements intact.     Pupils: Pupils are equal, round, and reactive to light.  Cardiovascular:     Rate and Rhythm: Normal rate and regular rhythm.     Heart sounds: Normal heart sounds. No murmur heard.    No gallop.  Pulmonary:     Effort: Pulmonary effort is normal. No respiratory distress.     Breath sounds: Normal breath sounds. No wheezing or  rales.  Skin:    General: Skin is warm and dry.  Neurological:     Mental Status: He is alert and oriented to person, place, and time.  Psychiatric:        Judgment: Judgment normal.     Assessment and Plan:   Mild persistent reactive airway disease without complication No red flags Will increase the strength of his inhaler to Advair 250 BID Continue albuterol prn Received depo-medrol injection 80 mg and did well with this Follow-up if new/worsening or lack of improvement  Chronic midline thoracic back pain Provided emotional support Declines any intervention at this time Continue to monitor  I, Luna Glasgow, acting as a Education administrator for [employer], PA.,have documented all relevant documentation on the behalf of Inda Coke, PA,as directed by  Inda Coke, PA while in the presence of Inda Coke, [place].  I, Inda Coke, [place], have reviewed all documentation for this visit. The documentation on 04/11/22 for the exam, diagnosis, procedures, and orders are all accurate and complete.   Inda Coke, PA-C

## 2022-04-11 NOTE — Patient Instructions (Signed)
It was great to see you!  Increase the strength of your inhaler -- I have sent this in for you.  Steroid shot today  Keep me posted on your work situation and let me know how I can best support you.  Take care,  Inda Coke PA-C

## 2022-05-10 ENCOUNTER — Other Ambulatory Visit: Payer: Self-pay | Admitting: Physician Assistant

## 2022-05-23 ENCOUNTER — Other Ambulatory Visit: Payer: Self-pay | Admitting: Physician Assistant

## 2022-05-23 DIAGNOSIS — I10 Essential (primary) hypertension: Secondary | ICD-10-CM

## 2022-06-09 ENCOUNTER — Other Ambulatory Visit: Payer: Self-pay | Admitting: Physician Assistant

## 2022-06-11 ENCOUNTER — Ambulatory Visit: Payer: BC Managed Care – PPO | Admitting: Physician Assistant

## 2022-06-11 ENCOUNTER — Encounter: Payer: Self-pay | Admitting: Physician Assistant

## 2022-06-11 VITALS — BP 120/70 | HR 97 | Temp 97.1°F | Ht 68.0 in | Wt 189.6 lb

## 2022-06-11 DIAGNOSIS — R051 Acute cough: Secondary | ICD-10-CM

## 2022-06-11 LAB — POCT RAPID STREP A (OFFICE): Rapid Strep A Screen: POSITIVE — AB

## 2022-06-11 MED ORDER — PREDNISONE 20 MG PO TABS
40.0000 mg | ORAL_TABLET | Freq: Every day | ORAL | 0 refills | Status: DC
Start: 2022-06-11 — End: 2022-07-23

## 2022-06-11 MED ORDER — AMOXICILLIN-POT CLAVULANATE 875-125 MG PO TABS: 1.0000 | ORAL_TABLET | Freq: Two times a day (BID) | ORAL | 0 refills | Status: DC

## 2022-06-11 MED ORDER — HYDROCOD POLI-CHLORPHE POLI ER 10-8 MG/5ML PO SUER: 5.0000 mL | Freq: Every evening | ORAL | 0 refills | Status: DC | PRN

## 2022-06-11 NOTE — Patient Instructions (Signed)
It was great to see you!  Strep test is positive  We will treat with oral antibiotic and oral prednisone  Please call with concerns!  Take care,  [name] PA-C

## 2022-06-11 NOTE — Progress Notes (Signed)
Thomas [name] is a 41 y.o. male here for a new problem.  History of Present Illness:   Chief Complaint  Patient presents with   Sore Throat    Pt states sore throat started last Tuesday, pr states Thursday night started getting a lot worse. Pt states tylenol or aleve helped. Pt states he started coughing and thouht bronchitis, pt states he gets it every year. Pt states throat no longer is sore or hurts, states he doesn't think it was strep. Pt states cough gotten a little louder and sounds like bronchitis.    He is here with in person ASL interpreter.  HPI  Cough Patient is complaining of symptoms that started with a sore throat on 12/5. He states that on 12/7 it was the worst as he couldn't sleep, experienced sinus congestion, fever, fatigue, and cough. He reports that the following day on Friday, his fever broke and his throat felt better, but coughing worsened. He confirms that he has been using his rescue inhaler. It was everyday, but is now less; he only uses it when he begins to wheeze. He suspects it is bronchitis as he gets it every year. Patient completed Covid tests on 12/5, 12/7, and 12/9 which all came back negative.He manages his symptoms with delsym and mucinex. He denies any change in appetite. Denies SOB, chest pain.  Past Medical History:  Diagnosis Date   Chronic back pain    Deaf    Hypertension      Social History   Tobacco Use   Smoking status: Never   Smokeless tobacco: Never  Substance Use Topics   Alcohol use: No   Drug use: No    History reviewed. No pertinent surgical history.  Family History  Problem Relation Age of Onset   Mental illness Mother    Diabetes Mother    Other Father 81       [place]   Diabetes Maternal Grandmother    Stroke Maternal Grandmother    Hypertension Maternal Grandmother    Cancer Maternal Grandfather        brain cancer   Other Paternal Grandfather        factory  explosion   Prostate cancer Neg Hx    Colon cancer Neg Hx     No Known Allergies  Current Medications:   Current Outpatient Medications:    ADVAIR DISKUS 250-50 MCG/ACT AEPB, INHALE 1 PUFF INTO THE LUNGS IN THE MORNING AND AT BEDTIME., Disp: 60 each, Rfl: 1   albuterol (PROVENTIL HFA;VENTOLIN HFA) 108 (90 Base) MCG/ACT inhaler, Inhale 2 puffs into the lungs every 6 (six) hours as needed for wheezing or shortness of breath., Disp: 1 Inhaler, Rfl: 0   amLODipine (NORVASC) 5 MG tablet, TAKE 1 TABLET (5 MG TOTAL) BY MOUTH DAILY., Disp: 90 tablet, Rfl: 0   ibuprofen (ADVIL) 400 MG tablet, Take 1 tablet (400 mg total) by mouth every 8 (eight) hours as needed for moderate pain., Disp: 30 tablet, Rfl: 0   meloxicam (MOBIC) 15 MG tablet, TAKE 1 TABLET BY MOUTH EVERY DAY AS NEEDED FOR PAIN, Disp: 30 tablet, Rfl: 0   simvastatin (ZOCOR) 40 MG tablet, TAKE 1 TABLET BY MOUTH EVERYDAY AT BEDTIME, Disp: 90 tablet, Rfl: 0   traMADol (ULTRAM) 50 MG tablet, Take 1 tablet (50 mg total) by mouth every 8 (eight) hours as needed. FOR CHRONIC PAIN, Disp: 30 tablet, Rfl: 0   Review of Systems:  Review of Systems  Respiratory:  Positive for cough.     Vitals:   Vitals:   06/11/22 1050  BP: 120/70  Pulse: 97  Temp: (!) 97.1 F (36.2 C)  TempSrc: Temporal  SpO2: 97%  Weight: 189 lb 9.6 oz (86 kg)  Height: 5\' 8"  (1.727 m)     Body mass index is 28.83 kg/m.  Physical Exam:   Physical Exam Vitals and nursing note reviewed.  Constitutional:      General: He is not in acute distress.    Appearance: Normal appearance. He is well-developed. He is not ill-appearing or toxic-appearing.  HENT:     Head: Normocephalic and atraumatic.     Right Ear: Tympanic membrane, ear canal and external ear normal. Tympanic membrane is not erythematous, retracted or bulging.     Left Ear: Tympanic membrane, ear canal and external ear normal. Tympanic membrane is not erythematous, retracted or bulging.     Nose: Nose  normal.     Right Sinus: No maxillary sinus tenderness or frontal sinus tenderness.     Left Sinus: No maxillary sinus tenderness or frontal sinus tenderness.     Mouth/Throat:     Pharynx: Uvula midline. Posterior oropharyngeal erythema present.     Tonsils: No tonsillar exudate.  Eyes:     General: Lids are normal.     Extraocular Movements: Extraocular movements intact.     Conjunctiva/sclera: Conjunctivae normal.     Pupils: Pupils are equal, round, and reactive to light.  Neck:     Trachea: Trachea normal.  Cardiovascular:     Rate and Rhythm: Normal rate and regular rhythm.     Heart sounds: Normal heart sounds, S1 normal and S2 normal. No murmur heard.    No gallop.  Pulmonary:     Effort: Pulmonary effort is normal. No respiratory distress.     Breath sounds: Normal breath sounds. No decreased breath sounds, wheezing, rhonchi or rales.  Lymphadenopathy:     Cervical: No cervical adenopathy.  Skin:    General: Skin is warm and dry.  Neurological:     Mental Status: He is alert and oriented to person, place, and time.  Psychiatric:        Speech: Speech normal.        Behavior: Behavior normal. Behavior is cooperative.        Judgment: Judgment normal.    Results for orders placed or performed in visit on 06/11/22  POCT rapid strep A  Result Value Ref Range   Rapid Strep A Screen Positive (A) Negative    Assessment and Plan:   Acute cough Strep test positive No red flags on exam Start oral augmentin given strep and sinusitis Oral prednisone 40 mg daily for cough Tussionex for at night -- sleepy precautions advised Follow-up as needed if new/worsening  I,Verona Buck,acting as a scribe for 14/11/23, PA.,have documented all relevant documentation on the behalf of [employer], PA,as directed by  [name], PA while in the presence of [name], [name].  I, [place], [name], have reviewed all documentation for this visit. The documentation on  06/11/22 for the exam, diagnosis, procedures, and orders are all accurate and complete.  14/11/23, PA-C

## 2022-07-11 ENCOUNTER — Ambulatory Visit: Payer: BC Managed Care – PPO | Admitting: Psychology

## 2022-07-19 ENCOUNTER — Encounter: Payer: Self-pay | Admitting: Psychology

## 2022-07-19 ENCOUNTER — Ambulatory Visit (INDEPENDENT_AMBULATORY_CARE_PROVIDER_SITE_OTHER): Payer: BC Managed Care – PPO | Admitting: Psychology

## 2022-07-19 DIAGNOSIS — F909 Attention-deficit hyperactivity disorder, unspecified type: Secondary | ICD-10-CM | POA: Diagnosis not present

## 2022-07-19 NOTE — Progress Notes (Signed)
[place] Counselor Initial Adult Exam  Name: Thomas [name] Date: 07/19/2022 MRN: [id number] DOB: [birth year] PCP: Inda Coke, PA  Time spent: 10-11 am  Guardian/Informant: Patient and interpreter  Paperwork requested: No  Met with patient and interpreter for initial interview.  Patient and interpreter were at the clinic and session was conducted from therapist's office in person.    Reason for Visit /Presenting Problem: Testing requested for ADHD.  Wife notices that patient is very distractible and has difficulty organizing.  The house is clutter and patient has difficulty finishing tasks at home.    Mental Status Exam: Appearance:   Casual and Well Groomed     Behavior:  Appropriate and Sharing  Motor:  Normal  Speech/Language:   Clear and Coherent and Normal Rate sign language  Affect:  Appropriate and Full Range  Mood:  euthymic  Thought process:  normal  Thought content:    WNL  Sensory/Perceptual disturbances:    WNL  Orientation:  oriented to person, place, time/date, and situation  Attention:  Good  Concentration:  Fair  Memory:  WNL  Fund of knowledge:   Good  Insight:    Good  Judgment:   Good  Impulse Control:  Good   Developmental History: Early delays - None Motor - Good.  Used to be in bowling league but not currently.  Fine motor ok Speech - hearing impaired - Signs well - expresses self to wife well.  Can express thoughts and feelings well.   Self Care - Good Independent - Good Social - Good - relates well to people in person and online  Reported Symptoms:  Sleeps well.  No changes in appetite.  Gets tired during the day.  No prolonged sadness or depression.  No sudden anxiety or panic.  No general worry or Typically no social anxiety unless around a lot of people who do not know he is deaf.  Thoughts go very fast.  Some mania.  Doesn't get angry easily and can calm down quickly when does so.  No obsessive thought. No  compulsive behavior.  Trouble paying attention.  Not sure when attention problems started.  Has a very good photogenic memory.  Kept very busy as a child.  Wife the first one to point out attention problems (married 20 years). Easily distracted except for videos.  Some losing and forgetting.  Has a specific place for things to keep from losing them.  Adequately organized but cluttered at home.  Can complete activities at work but not as much at home.  Some restlessness/fidgeting when can't relax (excessive thinking).  Interrupts others and is verbally impulsive (working on that).  No impulsive behavior.        Risk Assessment: Danger to Self:  No Self-injurious Behavior: No Danger to Others: No Duty to Warn:no Physical Aggression / Violence:No  Access to Firearms a concern: No  Gang Involvement:No  Patient / guardian was educated about steps to take if suicide or homicide risk level increases between visits: n/a While future psychiatric events cannot be accurately predicted, the patient does not currently require acute inpatient psychiatric care and does not currently meet [facility] involuntary commitment criteria.  Substance Abuse History: Current substance abuse: No     Past Psychiatric History:   No previous psychological problems have been observed Was frustrated with previous employer due to refusing to provide an interpreter.  Currently involved in a lawsuit with them.     Outpatient Providers:None History of Psych Hospitalization:  No  Psychological Testing:  None    Abuse History:  Victim of: No.,  Father died in Oct 11, 2006 which was traumatic for him.  Didn't attend funeral.  No PTSD symptoms.     Was stuck in [place] for 4 months during [place].  Needed [place] to get him home. Report needed: No. Victim of Neglect:No. Perpetrator of  None   Witness / Exposure to Domestic Violence: No   Protective Services Involvement: No  Witness to [employer] Violence:   Mostly on TV  especially when in [place] or [place].  Family History:  Family History  Problem Relation Age of Onset   Mental illness Mother    Diabetes Mother    Other Father 69       car accident in the  middle [place] during [place]   Diabetes Maternal Grandmother    Stroke Maternal Grandmother    Hypertension Maternal Grandmother    Cancer Maternal Grandfather        brain cancer   Other Paternal Grandfather        factory explosion   Prostate cancer Neg Hx    Colon cancer Neg Hx   Unsure of specific MH condition mother had but was hospitalized for it.  Sister has same condition.    Living situation: the patient lives with their spouse.Been married 19 years this year.  Good relations.  Close with brother and father but not mother and sister.  No family concerns.  Born [place] in [place].  Father originally from [place] and patient visits there often.     Sexual Orientation: Straight  Relationship Status: married  Name of spouse / other:Amanda. 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  Works for [employer] during the weekends so can drive ([employer] will not let him drive due to being deaf.  Currently unhappy with [employer] due to their discrimination.  Waiting until lawsuit and investigation.  Able to perform the job adequately.   gets settled before deciding upon his future with the company.  Plans on retiring when qualifies for pension.      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  Not able to find job in field due to being deaf so works for [employer].  Repairs comptuers on the side.  Was in public school through [employer] grade in deaf specialty school for 12th grade, then attended [employer].  Valedictorian in HS.  3.4 GPA in college.    Any cultural differences that  may affect / interfere with treatment:  Arabic Background.  Recreation/Hobbies: Video games, disc golf, watching football.  Card and toy collecting.    Stressors: Occupational concerns   Ready for case with [employer] to be over and move on from that place.  Strengths: Intelligent, organized - can be obsessive.  Committed to others, keep things clean.  Sense of humor., Likes helping others.     Barriers:  Deaf - having to communicate through sign.     Legal History: Pending legal issue / charges:  No charges but involved in discrimination lawsuit with place of employment .  Medical History/Surgical History: reviewed Past Medical History:  Diagnosis Date   Chronic back pain    Deaf    Hypertension     No past surgical  history on file.  Medications: Current Outpatient Medications  Medication Sig Dispense Refill   ADVAIR DISKUS 250-50 MCG/ACT AEPB INHALE 1 PUFF INTO THE LUNGS IN THE MORNING AND AT BEDTIME. 60 each 1   albuterol (PROVENTIL HFA;VENTOLIN HFA) 108 (90 Base) MCG/ACT inhaler Inhale 2 puffs into the lungs every 6 (six) hours as needed for wheezing or shortness of breath. 1 Inhaler 0   amLODipine (NORVASC) 5 MG tablet TAKE 1 TABLET (5 MG TOTAL) BY MOUTH DAILY. 90 tablet 0   amoxicillin-clavulanate (AUGMENTIN) 875-125 MG tablet Take 1 tablet by mouth 2 (two) times daily. 20 tablet 0   chlorpheniramine-HYDROcodone (TUSSIONEX) 10-8 MG/5ML Take 5 mLs by mouth at bedtime as needed for cough. 70 mL 0   ibuprofen (ADVIL) 400 MG tablet Take 1 tablet (400 mg total) by mouth every 8 (eight) hours as needed for moderate pain. 30 tablet 0   meloxicam (MOBIC) 15 MG tablet TAKE 1 TABLET BY MOUTH EVERY DAY AS NEEDED FOR PAIN 30 tablet 0   predniSONE (DELTASONE) 20 MG tablet Take 2 tablets (40 mg total) by mouth daily. 10 tablet 0   simvastatin (ZOCOR) 40 MG tablet TAKE 1 TABLET BY MOUTH EVERYDAY AT BEDTIME 90 tablet 0   traMADol (ULTRAM) 50 MG tablet Take 1 tablet (50 mg total) by mouth every 8  (eight) hours as needed. FOR CHRONIC PAIN 30 tablet 0   No current facility-administered medications for this visit.    No Known Allergies Allergy to mint.  No digestive problems.  No concussions or HI.     Diagnoses:  Attention deficit hyperactivity disorder (ADHD), unspecified ADHD type    Plan of Care: Patient presents with a history of inattention, distractibility, trouble following through with home tasks, and frequent interrupting.  Overly rapid thought, along with occasional hypomania and compulsive behavior was also mentioned.  Patient is deaf but graduated college and communicates effectively through sign language. Testing recommended to evaluate for ADHD as well as other conditions that may be affecting attention and behavior.    Test Battery - In person.   K-BIT-2, BRIEF-2, CNSVS, Adult ADHD, PAI.     Bryson Dames, PhD

## 2022-07-19 NOTE — Progress Notes (Signed)
                Thomas Ferrante, PhD 

## 2022-07-20 ENCOUNTER — Other Ambulatory Visit: Payer: Self-pay | Admitting: Physician Assistant

## 2022-07-20 ENCOUNTER — Encounter: Payer: Self-pay | Admitting: Physician Assistant

## 2022-07-20 DIAGNOSIS — M546 Pain in thoracic spine: Secondary | ICD-10-CM

## 2022-07-20 MED ORDER — TRAMADOL HCL 50 MG PO TABS: 50.0000 mg | ORAL_TABLET | Freq: Three times a day (TID) | ORAL | 0 refills | Status: DC | PRN

## 2022-07-20 NOTE — Telephone Encounter (Signed)
Please refuse medication Tussinex

## 2022-07-23 ENCOUNTER — Ambulatory Visit: Payer: BC Managed Care – PPO | Admitting: Physician Assistant

## 2022-07-23 ENCOUNTER — Encounter: Payer: Self-pay | Admitting: Physician Assistant

## 2022-07-23 VITALS — BP 130/86 | HR 99 | Temp 98.2°F | Ht 68.0 in | Wt 196.2 lb

## 2022-07-23 DIAGNOSIS — R509 Fever, unspecified: Secondary | ICD-10-CM

## 2022-07-23 LAB — POC INFLUENZA A&B (BINAX/QUICKVUE)
Influenza A, POC: NEGATIVE
Influenza B, POC: NEGATIVE

## 2022-07-23 MED ORDER — HYDROCOD POLI-CHLORPHE POLI ER 10-8 MG/5ML PO SUER: 5.0000 mL | Freq: Every evening | ORAL | 0 refills | Status: DC | PRN

## 2022-07-23 MED ORDER — DOXYCYCLINE HYCLATE 100 MG PO TABS: 100.0000 mg | ORAL_TABLET | Freq: Two times a day (BID) | ORAL | 0 refills | Status: DC

## 2022-07-23 MED ORDER — PREDNISONE 20 MG PO TABS
40.0000 mg | ORAL_TABLET | Freq: Every day | ORAL | 0 refills | Status: DC
Start: 2022-07-23 — End: 2022-08-06

## 2022-07-23 MED ORDER — METHYLPREDNISOLONE ACETATE 80 MG/ML IJ SUSP
80.0000 mg | Freq: Once | INTRAMUSCULAR | Status: AC
  Administered 2022-07-23: 80 mg via INTRAMUSCULAR

## 2022-07-23 MED ORDER — HYDROCODONE BIT-HOMATROP MBR 5-1.5 MG/5ML PO SOLN: 5.0000 mL | Freq: Three times a day (TID) | ORAL | 0 refills | Status: DC | PRN

## 2022-07-23 NOTE — Patient Instructions (Addendum)
--  Start doxycycline (antibiotic)  Cough syrup for at night  Delsym during the day  Steroid injection today   May take oral steroid starting tomorrow  Use albuterol as needed for additional shortness of breath/wheezing  If blood persists from ear, let me know -- they look red but no active bleeding  Work note provided -- message me if ANY concerns

## 2022-07-23 NOTE — Progress Notes (Signed)
Thomas [name] is a 42 y.o. male here for a follow up of a pre-existing problem.  History of Present Illness:   Chief Complaint  Patient presents with   Cough    Pt c/o started to feel sick on Friday runny nose, sore throat. Saturday same and little body aches and sore throat resolved. He says he is coughing and expectorating clear to light brown. Has fever on Sunday 101 sweated it out. Taking Dayquil, Tylenol, Nyquil and Delsym cough syrup.    Cough This is a new problem. The current episode started in the past 7 days. The problem has been gradually worsening. The problem occurs hourly. The cough is Productive of sputum. Associated symptoms include a fever, headaches, rhinorrhea, a sore throat and wheezing. Pertinent negatives include no chest pain or chills. The symptoms are aggravated by cold air. He has tried OTC cough suppressant for the symptoms. The treatment provided mild relief. His past medical history is significant for bronchitis.    Past Medical History:  Diagnosis Date   Chronic back pain    Deaf    Hypertension      Social History   Tobacco Use   Smoking status: Never   Smokeless tobacco: Never  Substance Use Topics   Alcohol use: No   Drug use: No    History reviewed. No pertinent surgical history.  Family History  Problem Relation Age of Onset   Mental illness Mother    Diabetes Mother    Other Father 42       car accident in the  middle [place] during [place]   Diabetes Maternal Grandmother    Stroke Maternal Grandmother    Hypertension Maternal Grandmother    Cancer Maternal Grandfather        brain cancer   Other Paternal Grandfather        factory explosion   Prostate cancer Neg Hx    Colon cancer Neg Hx     No Known Allergies  Current Medications:   Current Outpatient Medications:    ADVAIR DISKUS 250-50 MCG/ACT AEPB, INHALE 1 PUFF INTO THE LUNGS IN THE MORNING AND AT BEDTIME., Disp: 60 each, Rfl: 1   albuterol (PROVENTIL  HFA;VENTOLIN HFA) 108 (90 Base) MCG/ACT inhaler, Inhale 2 puffs into the lungs every 6 (six) hours as needed for wheezing or shortness of breath., Disp: 1 Inhaler, Rfl: 0   amLODipine (NORVASC) 5 MG tablet, TAKE 1 TABLET (5 MG TOTAL) BY MOUTH DAILY., Disp: 90 tablet, Rfl: 0   doxycycline (VIBRA-TABS) 100 MG tablet, Take 1 tablet (100 mg total) by mouth 2 (two) times daily., Disp: 20 tablet, Rfl: 0   ibuprofen (ADVIL) 400 MG tablet, Take 1 tablet (400 mg total) by mouth every 8 (eight) hours as needed for moderate pain., Disp: 30 tablet, Rfl: 0   meloxicam (MOBIC) 15 MG tablet, TAKE 1 TABLET BY MOUTH EVERY DAY AS NEEDED FOR PAIN, Disp: 30 tablet, Rfl: 0   simvastatin (ZOCOR) 40 MG tablet, TAKE 1 TABLET BY MOUTH EVERYDAY AT BEDTIME, Disp: 90 tablet, Rfl: 0   traMADol (ULTRAM) 50 MG tablet, Take 1 tablet (50 mg total) by mouth every 8 (eight) hours as needed. FOR CHRONIC PAIN, Disp: 30 tablet, Rfl: 0   chlorpheniramine-HYDROcodone (TUSSIONEX) 10-8 MG/5ML, Take 5 mLs by mouth at bedtime as needed for cough., Disp: 70 mL, Rfl: 0   predniSONE (DELTASONE) 20 MG tablet, Take 2 tablets (40 mg total) by mouth daily., Disp: 10 tablet, Rfl: 0  Review of Systems:   Review of Systems  Constitutional:  Positive for fever. Negative for chills.  HENT:  Positive for rhinorrhea and sore throat.   Respiratory:  Positive for cough and wheezing.   Cardiovascular:  Negative for chest pain.  Neurological:  Positive for headaches.    Vitals:   Vitals:   07/23/22 1520  BP: 130/86  Pulse: 99  Temp: 98.2 F (36.8 C)  TempSrc: Temporal  SpO2: 97%  Weight: 196 lb 4 oz (89 kg)  Height: 5\' 8"  (1.727 m)     Body mass index is 29.84 kg/m.  Physical Exam:   Physical Exam Vitals and nursing note reviewed.  Constitutional:      General: He is not in acute distress.    Appearance: He is well-developed. He is not ill-appearing or toxic-appearing.  HENT:     Head: Normocephalic and atraumatic.     Right Ear:  Tympanic membrane, ear canal and external ear normal. Tympanic membrane is not erythematous, retracted or bulging.     Left Ear: Tympanic membrane, ear canal and external ear normal. Tympanic membrane is not erythematous, retracted or bulging.     Nose:     Right Sinus: Frontal sinus tenderness present. No maxillary sinus tenderness.     Left Sinus: Frontal sinus tenderness present. No maxillary sinus tenderness.     Mouth/Throat:     Pharynx: Uvula midline. Posterior oropharyngeal erythema present.     Tonsils: No tonsillar exudate. 0 on the right. 0 on the left.  Eyes:     General: Lids are normal.     Conjunctiva/sclera: Conjunctivae normal.  Neck:     Trachea: Trachea normal.  Cardiovascular:     Rate and Rhythm: Normal rate and regular rhythm.     Heart sounds: Normal heart sounds, S1 normal and S2 normal.  Pulmonary:     Effort: Pulmonary effort is normal.     Breath sounds: Examination of the right-upper field reveals wheezing. Wheezing present. No decreased breath sounds, rhonchi or rales.  Lymphadenopathy:     Cervical: No cervical adenopathy.  Skin:    General: Skin is warm and dry.  Neurological:     Mental Status: He is alert.  Psychiatric:        Speech: Speech normal.        Behavior: Behavior normal. Behavior is cooperative.    Results for orders placed or performed in visit on 07/23/22  POC Influenza A&B(BINAX/QUICKVUE)  Result Value Ref Range   Influenza A, POC Negative Negative   Influenza B, POC Negative Negative    Assessment and Plan:   Fever, unspecified fever cause No red flags on exam.  Suspect bronchitis. Treat with doxycycline, tussionex for nighttime cough, delsym during the day. 80 mg depomedrol injection provided today. May use albuterol prn. Discussed taking medications as prescribed. Reviewed return precautions including worsening fever, SOB, worsening cough or other concerns. Push fluids and rest. I recommend that patient follow-up if  symptoms worsen or persist despite treatment x 7-10 days, sooner if needed.  Inda Coke, PA-C

## 2022-07-24 ENCOUNTER — Ambulatory Visit: Payer: BC Managed Care – PPO | Admitting: Physician Assistant

## 2022-08-01 ENCOUNTER — Encounter: Payer: Self-pay | Admitting: Psychology

## 2022-08-01 ENCOUNTER — Ambulatory Visit (INDEPENDENT_AMBULATORY_CARE_PROVIDER_SITE_OTHER): Payer: BC Managed Care – PPO | Admitting: Psychology

## 2022-08-01 DIAGNOSIS — F909 Attention-deficit hyperactivity disorder, unspecified type: Secondary | ICD-10-CM

## 2022-08-01 NOTE — Progress Notes (Signed)
                Marykay Mccleod, PhD 

## 2022-08-01 NOTE — Progress Notes (Signed)
[place] Counselor/Therapist Progress Note  Patient ID: Thomas [name], MRN: [id number],    Date: 08/01/2022  Time Spent: 12:00 - 2:30pm   Treatment Type: Testing  Met with patient and interpreter for testing session.  Patient and interpreter were at the clinic and session was conducted from therapist's office in person.    Reported Symptoms: Reason for Visit /Presenting Problem:  Patient presents with a history of inattention, distractibility, trouble following through with home tasks, and frequent interrupting.  Overly rapid thought, along with occasional hypomania and compulsive behavior was also mentioned.  Patient is deaf but graduated college and communicates effectively through sign language. Testing recommended to evaluate for ADHD as well as other conditions that may be affecting attention and behavior.     Mental Status Exam: Appearance:  Casual and Adequately Groomed     Behavior: Appropriate  Motor: Normal  Speech/Language:  Sign Language only  Affect: Appropriate  Mood: normal  Thought process: Appropriate - some difficulty translating complex English instructions into ASL  Thought content:   WNL  Sensory/Perceptual disturbances:   WNL  Orientation: oriented to person, place, time/date, and situation  Attention: Good  Concentration: Good  Memory: WNL  Fund of knowledge:  Fair  Insight:   Good  Judgment:  Good  Impulse Control: Good   Risk Assessment: Danger to Self:  No Self-injurious Behavior: No Danger to Others: No  Behavior Observations: Patient was cooperative and displayed good effort. Attention and concentration were adequate overall, although patient needed several questions to be repeated.  This may have been related to the interpretation process as parts of the questions or instructions may have gotten lost in translation. This caused the brief IQ testing to take longer than expected, with rating forms being completed more  quickly, but being noted for inconsistency in responding.  Mood was euthymic with appropriate affect.  The results appear representative of current functioning.    Subjective: Testing included the K-BIT 2R (1 hr. for testing and scoring) along with the CNS Vital signs (0.75 hrs.) BRIEF-A (0.25 hrs.), and PAI (0.5 hrs).     Diagnosis:Attention deficit hyperactivity disorder (ADHD), unspecified ADHD type  Plan: Testing complete. Report writing to be conducted followed by interactive feedback next session.    Rainey Pines, PhD

## 2022-08-06 ENCOUNTER — Encounter: Payer: Self-pay | Admitting: Psychology

## 2022-08-06 ENCOUNTER — Other Ambulatory Visit: Payer: Self-pay | Admitting: Physician Assistant

## 2022-08-06 MED ORDER — AZITHROMYCIN 250 MG PO TABS: ORAL_TABLET | ORAL | 0 refills | Status: AC

## 2022-08-06 MED ORDER — PREDNISONE 20 MG PO TABS: 40.0000 mg | ORAL_TABLET | Freq: Every day | ORAL | 0 refills | Status: DC

## 2022-08-06 MED ORDER — HYDROCODONE BIT-HOMATROP MBR 5-1.5 MG/5ML PO SOLN: 5.0000 mL | Freq: Three times a day (TID) | ORAL | 0 refills | Status: DC | PRN

## 2022-08-06 NOTE — Progress Notes (Signed)
[name] is a 42 y.o. male patient Report writing competed ( 3 hrs.).  Interactive feedback to be conducted next session. Report to be attached to the feedback progress note..  Patient/Guardian was advised Release of Information must be obtained prior to any record release in order to collaborate their care with an outside provider. Patient/Guardian was advised if they have not already done so to contact the registration department to sign all necessary forms in order for [place] to release information regarding their care.   Consent: Patient/Guardian gives verbal consent for treatment and assignment of benefits for services provided during this visit. Patient/Guardian expressed understanding and agreed to proceed.    Rainey Pines, PhD

## 2022-08-08 ENCOUNTER — Other Ambulatory Visit: Payer: Self-pay | Admitting: Physician Assistant

## 2022-08-13 ENCOUNTER — Ambulatory Visit: Payer: BC Managed Care – PPO | Admitting: Psychology

## 2022-08-13 ENCOUNTER — Encounter: Payer: Self-pay | Admitting: Psychology

## 2022-08-13 DIAGNOSIS — F422 Mixed obsessional thoughts and acts: Secondary | ICD-10-CM | POA: Diagnosis not present

## 2022-08-13 DIAGNOSIS — F3181 Bipolar II disorder: Secondary | ICD-10-CM

## 2022-08-13 NOTE — Progress Notes (Signed)
[place] Counselor/Therapist Progress Note  Patient ID: Thomas [name], MRN: [id number],    Date: 08/13/2022  Time Spent: 10-10:50 am   Treatment Type:  Testing - Feedback Session  Met with patient, wife, and interpreter to review results of testing.  Patient, wife and interpreter was at the clinic and session was conducted from therapist's office in person.      Reported Symptoms:  Patient presents with a history of inattention, distractibility, trouble following through with home tasks, and frequent interrupting.  Overly rapid thought, along with occasional hypomania and compulsive behavior was also mentioned.  Patient is deaf but graduated college and communicates effectively through sign language. Testing recommended to evaluate for ADHD as well as other conditions that may be affecting attention and behavior.      Subjective: Interactive feedback was conducted (1 hr.).  It was discussed how patient did not meet the criterion for ADHD along with how his behavior patterns were more consistent with Obsessive-Compulsive and Bipolar disorder.  Recommendations included discussing results with PCP, developing a visual organization system, and seeking individual counseling to help regulated.  Patient and wife expressed agreement with the results and recommendations, although patient expressed concern about the Bipolar Diagnosis as his mother has this condition and it is very debilitating for her.  The differences between Bipolar I and II were explained which helped the patient calm.       Total Time of Testing: 6.5 hrs. Testing and Scoring: 2.5 hrs. Interactive Feedback:1 hr. Report Writing: 3 hrs.   Diagnosis: Obsessive Compulsive Disorder   Bipolar II Disorder  Plan: Report to be sent to parent and referring provider.

## 2022-08-13 NOTE — Progress Notes (Signed)
Psychological Testing Report - Confidential  Identifying Information:               Patient's Name:   Thomas [name]  Date of Birth:              [birth year]     Age:                42 years  MRN#:                                   [id number]      Date of Assessment:              August 01, 2022         Purpose of Evaluation:  The purpose of the evaluation is to provide diagnostic information and treatment recommendations.     Referral Information: Thomas [name] was as 42 year old Arabic male.  He was referred to [facility] for psychological testing by Inda Coke, PA related to suspected attention and concentration deficits.  Thomas [name] reported that his wife notices that he is very distractible and has difficulty organizing.  The house is often cluttered, and Thomas [name] has difficulty finishing tasks at home.     Relevant Background Information:  Developmental history was reported to be significant for hearing and speech difficulty. There were no early delays.  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  Regarding current development, Thomas [name] indicated having adequate gross motor skills.  He used to participate in a bowling league but does not currently do so.  Fine motor skills were reported to be typical.  Regarding speech, Thomas [name] does not speak but communicates fluently through [employer] Language (ASL). He reported that he can express thoughts and feelings well through this medium.  Self-care skills were reported to be well developed.  Independent skills were reported to be good. Socially, Thomas [name] reported relating well to people in person and online.                 Medical history was reported to be significant for chronic back pain, hearing impairment, and Hypertension.  Past surgical history was denied.  Psychotropic medication use was denied, but he has taken medication for breathing and chronic pain.   Thomas [name] reported having an allergy to mint but denied a history of digestive problems, seizures, concussions, or head injuries.  Previous psychological problems were denied.  He has not seen any outpatient psychotherapy providers and has never had psychiatric hospitalization.  He has not participated in previous psychological testing.              Educationally, Thomas [name] reported graduating [employer] with a Major in [place] and a Minor English.  He reported being in public school through [id number] grade before transferring to a specialty school for those with hearing impairment in the 12th grade. He graduated high school as Scientific laboratory technician and earned a 3.4 GPA while in college.  He was not able to find job in his field, so he currently works for [employer] ([place] along with [employer] as a side job.  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  He is currently unhappy with [employer] due to their discrimination and is currently involved in a lawsuit with them.  He is waiting until the lawsuit and investigation get settled before deciding upon his future with the company.  He plans on retiring as soon as he qualifies for their pension.  Leisure activities include video games, disc golf, and watching football, along with card and toy collecting.      Thomas [name] currently lives with his wife Thomas [name].  This is his first marriage and they have been married for 19 years.  Good marital relations were reported.  He indicated being close with his brother and father but not his mother and sister.  Family concerns were denied.  Thomas [name] was born and raised in [place].  His father was originally from [place] and Thomas [name] visits there often.  He is supported most by his spouse.   Thomas [name] indicated  that was unsure of specific mental health conditions within the family, but his mother was hospitalized for emotional reasons and his sister has same difficulty.  He enjoyed his childhood overall.  His father died in 09/21/2006 which was reported to be traumatic for him.  He didn't attend the funeral but denied trauma related symptoms.  He was stuck in [place] for 4 months during the COVID-19 pandemic and needed help form the [employer] to get home.  Current stressors include his legal issues with his current employer.  Barriers include having to communicate through sign language with few people knowing ASL.  Strengths include being intelligent, organized can be overly organized), and being committed to others.  He likes to keep his surroundings clean, has a good sense of humor, and likes helping others.           Presenting Symptomology:  Thomas [name] reported that he sleeps well and denied recent changes in appetite but reported getting tired during the day.  He denied episodes of prolonged sadness or depression.  He also denied sudden anxiety or panic, general worry, or social anxiety unless he is around a lot of people who do not know he is deaf.  His thoughts were reported to go very fast.  Some mania was indicated, although Thomas [name] stated that he doesn't get angry easily and can calm down quickly when he does so.  Obsessive thought and compulsive behavior were denied.  Thomas [name] reported having trouble paying attention.  He was not sure when the attention problems started.  He reported having a very good memory, which helped him do well in school and he was kept very busy as a child.  Thomas [name] wife was the first one to point out attention problems to him.  He becomes easily distracted except when watching videos.  Some losing and forgetting was reported, although he has a specific place for important items to keep from losing them.  He indicated being adequately organized overall but cluttered at  home.  He can complete activities at work but not as much at home.  Thomas [name] reported some restlessness/fidgeting was when can't relax, due to excessive thinking.  He interrupts others and is verbally impulsive while impulsive behavior was denied.                  Procedures Administered: Terie Purser Brief Intelligence Test - 2 CNS Vital Signs Behavior Rating Inventory for Executive Function - Adult (BRIEF-A) Adult ADHD Self Report Scale  Personality Assessment Inventory  Behavioral Observations:  Mr. Balthazor participated in the evaluation at the clinic with the assistance of an interpreter.  He was cooperative and displayed good effort. Attention and concentration were adequate overall, although Mr. Kretzer needed several questions to be repeated.  This may have been related to the interpretation process as parts of the questions or instructions may have gotten lost in translation. This caused the brief IQ testing to take longer than expected, with self-report rating forms being completed more quickly, but being noted for inconsistency in responding.  Mood was euthymic with appropriate affect.  The results appear representative of current functioning.  Brief mental status indicated typical general orientation and alertness.  Recent, remote, immediate, and delayed memory were intact.  Working memory was typically developed.  Judgement and insight were good with typical abstract thinking.  Hallucinations, delusions, and thoughts of self-harm were denied.  Test Results and Interpretation:   General Intellectual Functioning: Terie Purser Brief Intelligence Test - 2 Composite Score Summary  Composite Scores  Sum of Scaled Scores Composite Score Percentile Rank 90% Confidence Interval Qualitative Description  Verbal Comprehension  VC  79           86  18  81-92         Low Average  Nonverbal Reasoning PR 41 109 73 103-114  Average  Composite IQ  FSIQ -   97 42 93-101            Average   K-BIT 2  Continued Domain Subtest Name  Total Raw Score Scaled Score Percentile Rank  Verbal Verbal Knowledge VK 48  8 25  Comprehension Riddles Ri 31  7 16   $ The K-BIT 2 was used to assess Mr. Sneeringer performance across two areas of cognitive ability. When interpreting these scores, it is important to view the results as a snapshot of current intellectual functioning. As measured by the K-BIT 2, Mr. Aronhalt Composite IQ score fell within the average range when compared to same age peers (CIQ = 41).  Mr. Mear performance was highly inconsistent across the Primary Index Scores, as Verbal Comprehension (VCI = 86) was low average while Perceptual Reasoning (PRI = 109) were average.  This indicates stronger visual learning ability than language understanding, although some of this difference may be attributed to translation from spoken to sign language.  On individual subtests, Mr. Sharum performed within the average range for visual pattern analysis (matrices) and verbal knowledge, with low average inferential thinking (riddles).  Overall, Mr. Murrow appears to have adequately developed comprehension ability.    Attention and Processing:                                                       CNS Vital Signs   Domain Scores Standard Score %ile Validity Indicator Guideline  Neurocognitive Index 95 37 Yes Average  Composite Memory 82 12 Yes Below Average  Verbal Memory 79 8 Yes Low  Visual Memory 92 30 Yes Average  Psychomotor speed 95 37 Yes Average  Reaction Time 86 18 Yes Low Average  Complex Attention 105 63 Yes Average  Cognitive Flexibility 107 68 Yes Average  Processing Speed 105 63 Yes Average  Executive Function 107 68 Yes Average  Working Memory 90 25 Yes Average  Sustained Attention 97 42 Yes Average  Simple  Attention  94 34 Yes Average  Motor Speed 89 23 Yes Low Average   The results of the CNS Vital Signs testing indicated average neurocognitive processing ability, at a level consistent  with measured intellectual ability (average).  Regarding areas related to attention problems, simple attention, complex attention, sustained attention, cognitive flexibility, and executive function were average.  These are the domains most closely associated with attention deficits.  Psychomotor speed and processing speed were average, while reaction time was low average, indicating age typical hand speed and thinking speed, with mildly slow responsiveness on computerized measures.  Visual and working memory were average while visual memory was a relative weakness in the low range.  The results suggest that Mr. Litterer appears to have age typical ability attending to simple and complex tasks with typical thinking speed and overall memory but mild slow responsiveness and trouble remembering words.  All measures were deemed valid.   Executive Function:  BRIEF-A Score Summary Table Scale/Index Raw score T score Percentile  Inhibit 19 76 99  Shift 12 66 98  Emotional Control 21 66 96  Self-Monitor 12 64 95  Behavioral Regulation Index (BRI) 64 72 98  Initiate 16 66 95  Working Memory 20 83 >99  Plan/Organize 21 71 98  Task Monitor 11 61 92  Organization of Materials 12 49 64  Metacognition Index (MI) 80 68 96  Global Executive Composite (GEC) 144 72 99   Mr. Tumblin completed the Self-Report Form of the Behavior Rating Inventory of Executive Function-Adult Version (BRIEF-A) on 08/01/2022. There are no missing item responses in the protocol. Ratings of Mr. Luong self-regulation do not appear overly negative. Items were completed in a reasonable fashion, suggesting that the respondent did not respond to items in a haphazard or extreme manner. Responses are reasonably consistent. In the context of these validity considerations, ratings of Mr. Hemming everyday executive function suggest some areas of concern. The overall index, the Global Executive Composite (GEC), was elevated (GEC T = 72, %ile = 99).  Both the Behavioral Regulation (BRI) and the Metacognition (MI) Indexes were elevated (BRI T = 72, %ile = 98 and MI T = 68, %ile = 96).     Within these summary indicators, all the individual scales are valid. One or more of the individual BRIEF-A scales were elevated, suggesting that Mr. Pasini reports difficulty with some aspects of executive function. Concerns are noted with his ability to inhibit impulsive responses, adjust to changes in routine or task demands, modulate emotions, initiate problem solving or activity, sustain working memory, and plan and organize problem-solving approaches. Mr. Carry ability to monitor social behavior, attend to task-oriented output, and organize environment and materials is not described as problematic.        Mr. Harriel scores on the Shift and Emotional Control scales are elevated compared to age-matched peers. This profile suggests significant problem-solving rigidity combined with emotional dysregulation. Individuals with this profile tend to lose emotional control when their routines or perspectives are challenged and/or flexibility is required.  In addition, Mr. Levison elevated scores on the Inhibit scale, and the Behavioral Regulation and the Metacognition Indexes, suggest that he is perceived as having poor inhibitory control and/or suggest that more global behavioral dysregulation is having a negative effect on active metacognitive problem solving.  Behavioral - Emotional Functioning: Ratings of behavioral functioning indicated much difficulty with attention and organizational, along with few problems regarding physical restlessness and impulse control.  On the Adult ADHD Self Report Scale, Mr. Leiman  positively endorsed, as occurring often or very often, 6 of 9 items for intention/poor organization and 7 of 9 items for hyperactivity and poor impulse control.  Additionally, 4 of 6 critical items was highly endorsed including problems starting tasks and  organizing with frequent fidgeting and feeling compelled to move.  Endorsement of at least 5 items in either category along with 4 critical items is considered at-risk for ADHD.   Ratings of emotional functioning indicated much emotional distress.  On the Personality Assessment Inventory, Mr. Pellegrini responses indicated a clinically significant level of mania, aggression, and feelings of nonsupport, along with at-risk levels of somatization, anxiety, anxiety related disorders, paranoia, and borderline personality features.  More specifically, Mr. Trzcinski highly endorsed items related to health concerns, obsessive-compulsive symptoms, specific fears, activity level, irritability, emotional instability, and an aggressive attitude.  At-risk levels of physical and emotional anxiety, persecution, resentment, identity problems, negative relationships, egocentricity, and verbal aggression were also indicated.  The results are most consistent with individuals diagnosed with obsessive-compulsive disorder, anxiety disorders, Post Traumatic Stress Disorder (PTSD), and Bipolar Disorder.  The results, however, should be interpreted with caution as the validity scales indicated a moderately high score for inconsistency, suggesting that Mr. Blauser may have had difficulty interpreting some of the questions.  This is consistent with his low average verbal comprehension score on the K-BIT 2.           Summary:   Mr. Holtzinger was evaluated during January 2023 related to suspicion of attention deficits.  Mr. Coots presents with a history of inattention, distractibility, trouble following through with home tasks, and frequent interrupting.  Overly rapid thought, along with occasional hypomania and compulsive behavior was also mentioned.  Mr. Bollenbach is deaf, but he graduated college and communicates effectively through sign language. Testing was recommended to evaluate for ADHD as well as other conditions that may be affecting attention  and behavior.  Test results indicated average overall intelligence (K-BIT 2), with better developed Nonverbal Reasoning than Verbal Comprehension, although some of that may have been related to interpreting standardized testing instructions and questions in English through [employer] (an interpreter was present).  Neurocognitive skills testing indicated age typical attention for simple and complex tasks with typically developed overall memory but mildly slow responsiveness and poor memory for words.  Ratings of executive function (EF), on the other hand, indicated impaired EF overall, with significantly elevated scores in the areas of behavior, emotion, and thought regulation.  Ratings for behavioral functioning high rates of inattention, poor organization, hyperactivity, and impulsivity.  Additional ratings indicated high levels of mania, obsessive-compulsive symptoms, poor family relations, and aggression, although there was much inconsistency in responding.  Additionally, developmental history indicated few problems prior to adulthood when his wife of almost 20 years first noticed difficulty with attention and emotion regulation.  Mr. Ralph does not appear to meet the criterion for ADHD due to lack of childhood onset, with his behavior profile more consistent with Bipolar and Obsessive-Compulsive Disorder.  Bipolar II seems more likely than Bipolar I Disorder due to Mr. Noska denying significant sleep disturbance.  Recommendations include discussing results with Primary Care Physician or psychiatrist, developing a visual organization system for home, and considering individual counseling.  See below for further recommendations.        Diagnostic Impression: DSM 5  Obsessive Compulsive Disorder   Bipolar II Disorder  Recommendations: Recommendations are to discuss results with Primary Care Physician or Psychiatrist and review medication options give the above diagnosis.  Medication for  attention deficits does not seem warranted as it could increase arousal, thought, and subjective feelings of anxiety.  Consider medication to manage mood and obsessive thought as they appear to be interfering with daily functioning.      Individual counseling is recommended to help Mr. Crichlow with emotion regulation and managing obsessive thought.  Mr. Kuehler would benefit from a structured therapy approach that focuses on greater understanding and coping skills for these conditions.    Mental alertness/energy can also be raised by increasing exercise, improving sleep, eating a healthy diet, and managing depression/stress.  Consult with a physician regarding any changes to physical regimen.  Remembering verbal information can be enhanced by asking the speaker to chunk the information into smaller segments and using visual reminders such as calendars, schedules, task lists, and reminder notes. These can be programmed into a computer, smart phone, or tablet for convenient access.   Memory for reading can be enhanced by reading out loud or underlying key words and passages.  Executive dysfunction can significantly impact an individual's ability to function at home, at school, at work, or in [employer]. Several different approaches to executive function intervention have been developed by neuropsychologists, rehabilitation specialists, and others that are aimed at helping individuals cope with executive dysfunction. One type of intervention involves the application of cognitive remediation techniques that typically emphasize repeated practice or building habits that bypass executive function. Another type of intervention involves teaching compensatory strategies. Compensatory strategies themselves can take several forms including using external aides (e.g., use of a notebook), learning cognitive strategies (e.g., verbalization), and making environmental modifications (e.g., keeping workspace clutter-free).    Please contact this provider if further information is needed.     Revonda Standard Marqueze Ramcharan, Ph.D. Licensed Psychologist - [facility] Licensed psychologist [phone]               Rainey Pines, PhD

## 2022-08-13 NOTE — Progress Notes (Signed)
                Randa Riss, PhD 

## 2022-08-15 ENCOUNTER — Other Ambulatory Visit: Payer: Self-pay | Admitting: Physician Assistant

## 2022-08-21 ENCOUNTER — Other Ambulatory Visit: Payer: Self-pay | Admitting: Physician Assistant

## 2022-08-21 DIAGNOSIS — I10 Essential (primary) hypertension: Secondary | ICD-10-CM

## 2022-08-22 ENCOUNTER — Other Ambulatory Visit: Payer: Self-pay | Admitting: Physician Assistant

## 2022-08-22 DIAGNOSIS — J4530 Mild persistent asthma, uncomplicated: Secondary | ICD-10-CM

## 2022-09-12 ENCOUNTER — Encounter: Payer: Self-pay | Admitting: Physician Assistant

## 2022-11-16 ENCOUNTER — Other Ambulatory Visit: Payer: Self-pay | Admitting: Physician Assistant

## 2022-11-17 ENCOUNTER — Other Ambulatory Visit: Payer: Self-pay | Admitting: Physician Assistant

## 2022-11-17 DIAGNOSIS — I10 Essential (primary) hypertension: Secondary | ICD-10-CM

## 2023-01-24 IMAGING — DX DG ANKLE COMPLETE 3+V*R*
3 series · 3 of 3 positions shown · non-contrast
Comparison: None.

CLINICAL DATA: Right ankle pain.

EXAM:
RIGHT ANKLE - COMPLETE 3+ VIEW

[ankle ap]
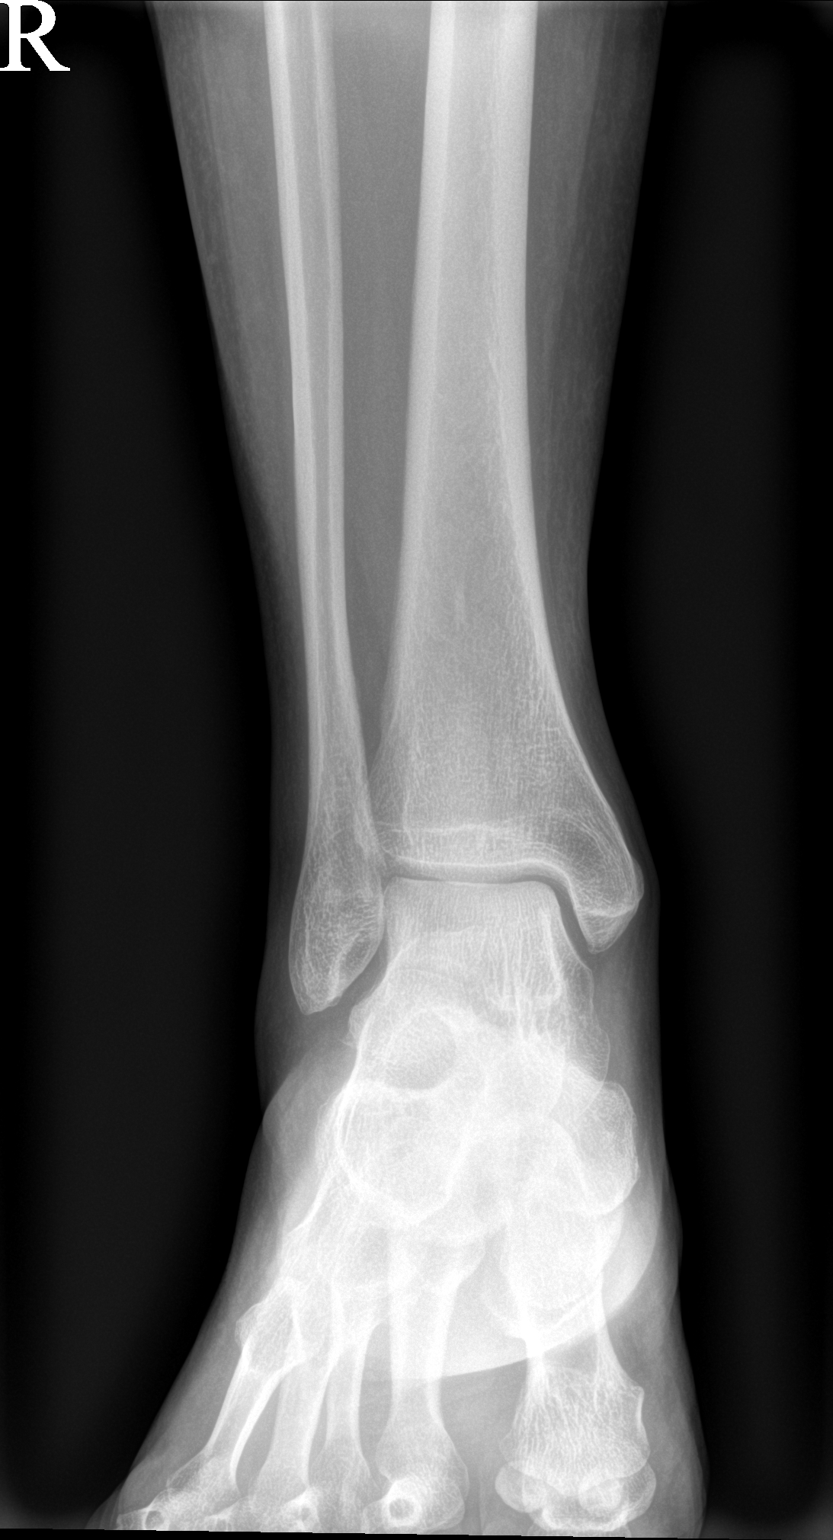

[ankle obl]
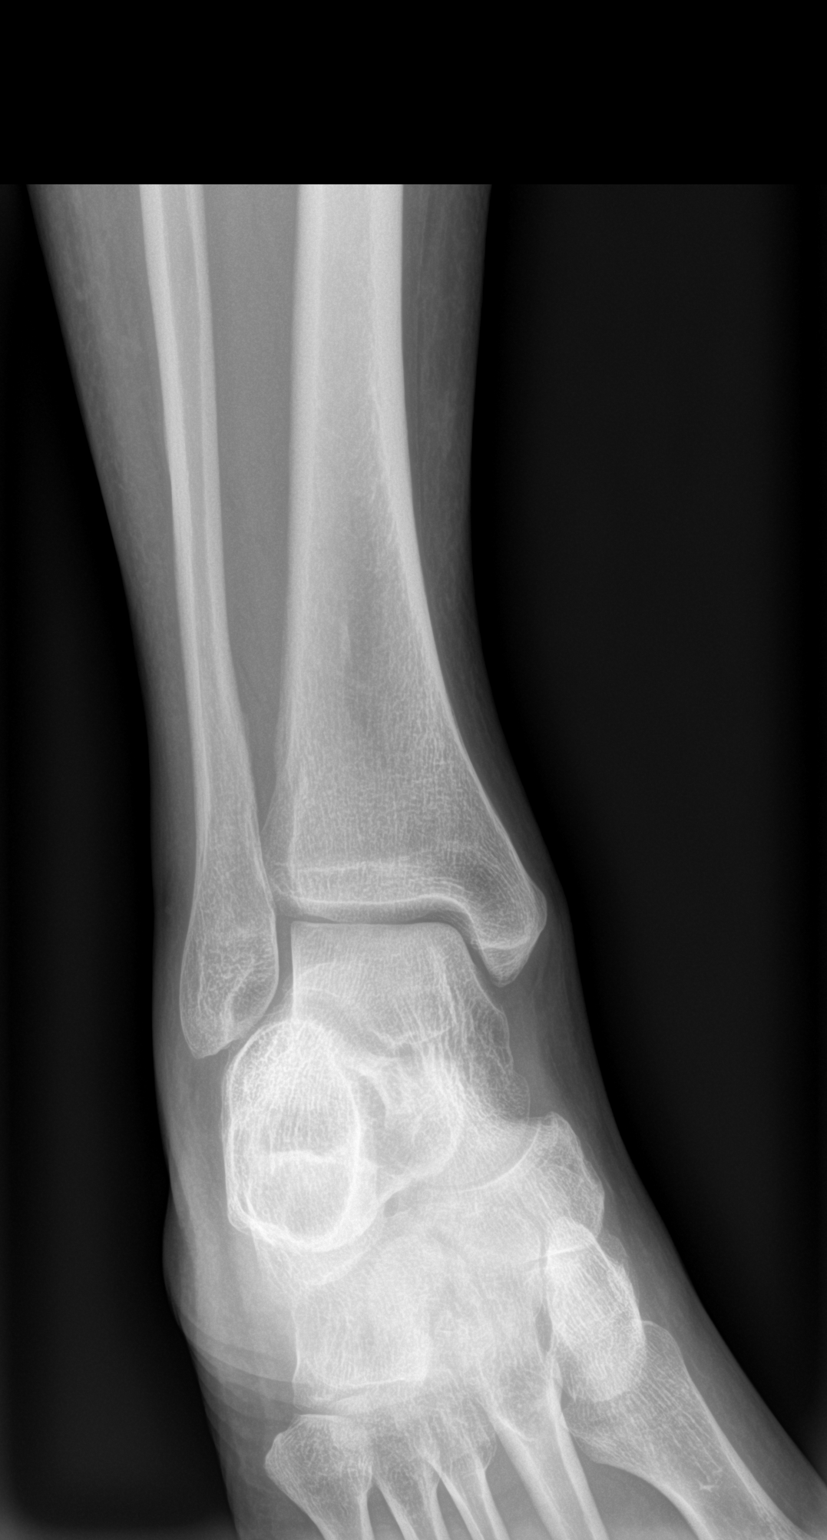

[ankle lat]
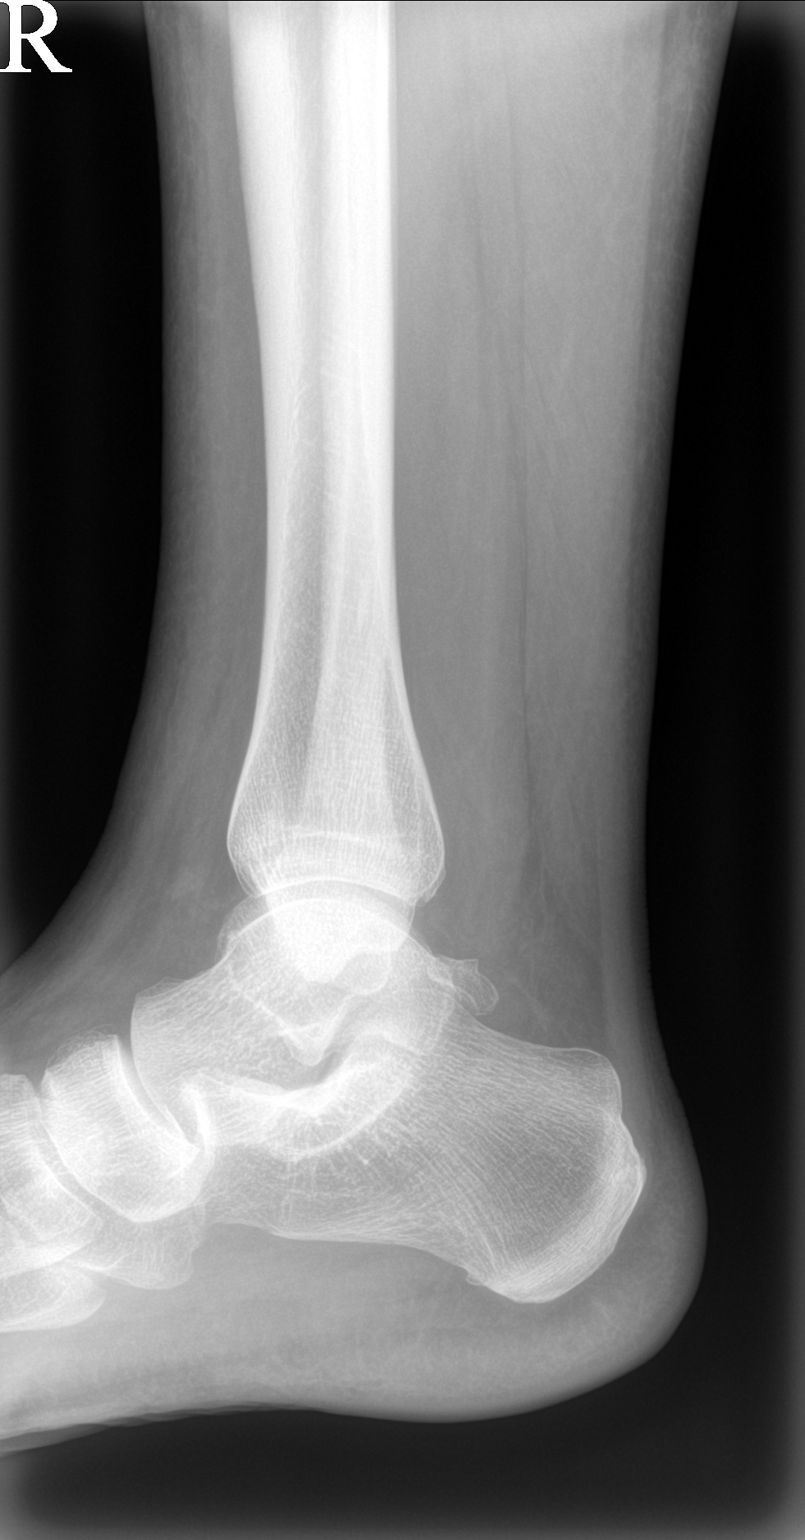

[3 of 3 positions shown; findings below may reference images not displayed]

FINDINGS: There is no evidence of fracture, dislocation, or joint effusion.
There is no evidence of arthropathy or other focal bone abnormality.
Soft tissues are unremarkable.
IMPRESSION: Negative.

## 2023-02-23 ENCOUNTER — Other Ambulatory Visit: Payer: Self-pay | Admitting: Physician Assistant

## 2023-02-24 ENCOUNTER — Other Ambulatory Visit: Payer: Self-pay | Admitting: Physician Assistant

## 2023-02-24 DIAGNOSIS — I10 Essential (primary) hypertension: Secondary | ICD-10-CM

## 2023-03-28 ENCOUNTER — Encounter: Payer: Self-pay | Admitting: Physician Assistant

## 2023-03-28 ENCOUNTER — Ambulatory Visit: Payer: 59 | Admitting: Physician Assistant

## 2023-03-28 VITALS — BP 130/84 | HR 99 | Temp 98.4°F | Ht 68.0 in | Wt 193.0 lb

## 2023-03-28 DIAGNOSIS — J4530 Mild persistent asthma, uncomplicated: Secondary | ICD-10-CM

## 2023-03-28 MED ORDER — AZITHROMYCIN 250 MG PO TABS: ORAL_TABLET | ORAL | 0 refills | Status: AC

## 2023-03-28 MED ORDER — BECLOMETHASONE DIPROP HFA 40 MCG/ACT IN AERB: 1.0000 | INHALATION_SPRAY | Freq: Two times a day (BID) | RESPIRATORY_TRACT | 0 refills | Status: DC

## 2023-03-28 MED ORDER — HYDROCOD POLI-CHLORPHE POLI ER 10-8 MG/5ML PO SUER: 5.0000 mL | Freq: Every evening | ORAL | 0 refills | Status: DC | PRN

## 2023-03-28 NOTE — Progress Notes (Signed)
Thomas [name] is a 42 y.o. male here for a new problem.  History of Present Illness:   Chief Complaint  Patient presents with   Cough    Pt c/o cough, runny nose started 2 days ago, headache today. Denies fever or chills. COVID test Negative yesterday. He is using Delysm cough syrup and Mucinex.    Accompanied by Alesia Richards for ASL interpretation.  Cough  He complains of a cough and a runny nose that started about 2 days ago. He believes it might just be a seasonal infection.  He was using the leaf blower then experienced a runny nose, his symptoms started shortly after. He is also experiencing accompanying congestion, SOB, and wheezing.  He has been using his inhaler due to wheezing and SOB.  He has been using Delysm and Mucinex to relief his symptoms.  He has not been sleeping well due to his cough, typically waking up 2-3x a night due to it.  Covid test done yesterday was negative.  He has been using advair day and night during allergy seasons. He is unsure if the inhaler is improving his symptoms.  He states that no one in the house or work is currently sick. Although his blood pressure is high today, he is not worried about it.  He has been using his albuterol inhaler as of last night  His wixela and advair inhalers in the past cause significant coughing after use  Past Medical History:  Diagnosis Date   Chronic back pain    Deaf    Hypertension      Social History   Tobacco Use   Smoking status: Never   Smokeless tobacco: Never  Substance Use Topics   Alcohol use: No   Drug use: No    No past surgical history on file.  Family History  Problem Relation Age of Onset   Mental illness Mother    Diabetes Mother    Other Father 79       [place]   Diabetes Maternal Grandmother    Stroke Maternal Grandmother    Hypertension Maternal Grandmother    Cancer Maternal Grandfather        brain cancer    Other Paternal Grandfather        factory explosion   Prostate cancer Neg Hx    Colon cancer Neg Hx     No Known Allergies  Current Medications:   Current Outpatient Medications:    albuterol (PROVENTIL HFA;VENTOLIN HFA) 108 (90 Base) MCG/ACT inhaler, Inhale 2 puffs into the lungs every 6 (six) hours as needed for wheezing or shortness of breath., Disp: 1 Inhaler, Rfl: 0   amLODipine (NORVASC) 5 MG tablet, TAKE 1 TABLET (5 MG TOTAL) BY MOUTH DAILY., Disp: 90 tablet, Rfl: 0   azithromycin (ZITHROMAX) 250 MG tablet, Take 2 tablets on day 1, then 1 tablet daily on days 2 through 5, Disp: 6 tablet, Rfl: 0   beclomethasone (QVAR) 40 MCG/ACT inhaler, Inhale 1 puff into the lungs 2 (two) times daily., Disp: 1 each, Rfl: 0   chlorpheniramine-HYDROcodone (TUSSIONEX) 10-8 MG/5ML, Take 5 mLs by mouth at bedtime as needed for cough., Disp: 115 mL, Rfl: 0   ibuprofen (ADVIL) 400 MG tablet, Take 1 tablet (400 mg total) by mouth every 8 (eight) hours as needed for moderate pain., Disp: 30 tablet, Rfl: 0   meloxicam (MOBIC) 15 MG tablet, TAKE 1 TABLET BY MOUTH EVERY DAY AS NEEDED  FOR PAIN, Disp: 30 tablet, Rfl: 0   simvastatin (ZOCOR) 40 MG tablet, TAKE 1 TABLET BY MOUTH EVERYDAY AT BEDTIME, Disp: 90 tablet, Rfl: 0   traMADol (ULTRAM) 50 MG tablet, Take 1 tablet (50 mg total) by mouth every 8 (eight) hours as needed. FOR CHRONIC PAIN, Disp: 30 tablet, Rfl: 0   Review of Systems:   Review of Systems  Constitutional:  Negative for chills and fever.  HENT:  Positive for congestion.   Respiratory:  Positive for cough, shortness of breath and wheezing.   Psychiatric/Behavioral:         +sleep disturbance    Vitals:   Vitals:   03/28/23 1019 03/28/23 1100  BP: (!) 150/80 130/84  Pulse: 99   Temp: 98.4 F (36.9 C)   TempSrc: Temporal   SpO2: 95%   Weight: 193 lb (87.5 kg)   Height: 5\' 8"  (1.727 m)      Body mass index is 29.35 kg/m.  Physical Exam:   Physical Exam Vitals and nursing note  reviewed.  Constitutional:      General: He is not in acute distress.    Appearance: He is well-developed. He is not ill-appearing or toxic-appearing.  HENT:     Head: Normocephalic and atraumatic.     Right Ear: Tympanic membrane, ear canal and external ear normal. Tympanic membrane is not erythematous, retracted or bulging.     Left Ear: Tympanic membrane, ear canal and external ear normal. Tympanic membrane is not erythematous, retracted or bulging.     Nose: Nose normal.     Right Sinus: No maxillary sinus tenderness or frontal sinus tenderness.     Left Sinus: No maxillary sinus tenderness or frontal sinus tenderness.     Mouth/Throat:     Pharynx: Uvula midline. No posterior oropharyngeal erythema.  Eyes:     General: Lids are normal.     Conjunctiva/sclera: Conjunctivae normal.  Neck:     Trachea: Trachea normal.  Cardiovascular:     Rate and Rhythm: Normal rate and regular rhythm.     Heart sounds: Normal heart sounds, S1 normal and S2 normal.  Pulmonary:     Effort: Pulmonary effort is normal.     Breath sounds: Normal breath sounds. No decreased breath sounds, wheezing, rhonchi or rales.  Lymphadenopathy:     Cervical: No cervical adenopathy.  Skin:    General: Skin is warm and dry.  Neurological:     Mental Status: He is alert.  Psychiatric:        Speech: Speech normal.        Behavior: Behavior normal. Behavior is cooperative.     Assessment and Plan:   Mild persistent reactive airway disease without complication No red flags on exam.   Will initiate QVAR inhaler per orders - I think he cannot tolerate the powdered inhalers and we will try to steer away from that Trial tussionex at night for cough - sleepy precautions advised Pocket prescription of azithromycin provided if these fail to improve symptoms Discussed taking medications as prescribed.  Reviewed return precautions including worsening fever, SOB, worsening cough or other concerns. Push fluids and  rest. I recommend that patient follow-up if symptoms worsen or persist despite treatment x 7-10 days, sooner if needed.   [name], PA-C  I,Safa M Kadhim,acting as a scribe for [name], PA.,have documented all relevant documentation on the behalf of [name], PA,as directed by  [name], PA while in the presence of [name], [place].  Cecil Cobbs, PA, have reviewed all documentation for this visit. The documentation on 03/28/23 for the exam, diagnosis, procedures, and orders are all accurate and complete.

## 2023-03-28 NOTE — Patient Instructions (Addendum)
It was great to see you!  Let's try a new inhaler -- sometimes the powder inhalers can be irritating   Use delsym during the day and prescription cough syrup at night  If no improvement of symptom(s) in a few days, start antibiotic  Take care,  [name] PA-C

## 2023-03-29 ENCOUNTER — Encounter: Payer: Self-pay | Admitting: Physician Assistant

## 2023-03-29 ENCOUNTER — Other Ambulatory Visit: Payer: Self-pay | Admitting: Physician Assistant

## 2023-03-29 MED ORDER — GUAIFENESIN-CODEINE 100-10 MG/5ML PO SYRP: 5.0000 mL | ORAL_SOLUTION | Freq: Three times a day (TID) | ORAL | 0 refills | Status: DC | PRN

## 2023-04-09 ENCOUNTER — Other Ambulatory Visit: Payer: Self-pay | Admitting: Physician Assistant

## 2023-04-09 DIAGNOSIS — I10 Essential (primary) hypertension: Secondary | ICD-10-CM

## 2023-04-10 ENCOUNTER — Ambulatory Visit: Payer: 59 | Admitting: Physician Assistant

## 2023-04-10 MED ORDER — AMLODIPINE BESYLATE 5 MG PO TABS: 5.0000 mg | ORAL_TABLET | Freq: Every day | ORAL | 0 refills | Status: DC

## 2023-04-10 MED ORDER — SIMVASTATIN 40 MG PO TABS: ORAL_TABLET | ORAL | 0 refills | Status: DC

## 2023-04-12 ENCOUNTER — Encounter: Payer: Self-pay | Admitting: Physician Assistant

## 2023-04-18 ENCOUNTER — Other Ambulatory Visit: Payer: Self-pay | Admitting: Physician Assistant

## 2023-04-18 ENCOUNTER — Encounter: Payer: Self-pay | Admitting: Physician Assistant

## 2023-04-18 MED ORDER — PREDNISONE 20 MG PO TABS: 20.0000 mg | ORAL_TABLET | Freq: Two times a day (BID) | ORAL | 0 refills | Status: DC

## 2023-05-08 NOTE — Progress Notes (Signed)
Subjective:    Thomas [name] is a 42 y.o. male and is here for a comprehensive physical exam.  HPI  He is here with in-person interpreter  There are no preventive care reminders to display for this patient.  Acute Concerns: None  Chronic Issues: Hypertension Treated with amlodipine 5 mg daily. Blood pressure elevated in-office today at 137/88. Ran out of amlodipine yesterday. Denies chest pain.  Chronic Back Pain Treated with meloxicam 15 mg as needed tramadol 50 mg as needed  He stopped taking these since starting new job, which is less physical.  Hyperlipidemia Treated with simvastatin 40 mg daily.  Reactive Airway Disease Treated with albuterol, Qvar inhalers. Has had dry cough recently since weather has become colder. Laughing triggers cough. Had some heartburn recently after eating fried food. Has some wheezing. Using Qvar 1 puff morning and evening. Wheezing has not worsened since starting new job.  Labs are pending. Denies skin problems, constipation, diarrhea, rectal bleeding. Has some mild swelling in lower legs but does not believe it is bad enough to warrant a medication change. Tried compression socks and did not like them.  Health Maintenance: Immunizations -- UTD on tetanus vaccine. Colonoscopy -- N/A PSA -- No results found for: "PSA1", "PSA" Diet -- Working on eating healthier. Sleep habits -- Sleeping better after starting new job. Exercise -- Plans to start walking more frequently.  Weight -- @FLOWAMB (14)@  Recent weight history Wt Readings from Last 10 Encounters:  05/15/23 196 lb 8 oz (89.1 kg)  03/28/23 193 lb (87.5 kg)  07/23/22 196 lb 4 oz (89 kg)  06/11/22 189 lb 9.6 oz (86 kg)  04/11/22 187 lb (84.8 kg)  03/13/22 186 lb 8 oz (84.6 kg)  01/03/22 188 lb (85.3 kg)  07/10/21 194 lb 6.4 oz (88.2 kg)  06/12/21 189 lb 9.6 oz (86 kg)  06/05/21 190 lb 9.6 oz (86.5 kg)   Body mass index is 29.88 kg/m.  Mood --  Stable Alcohol use --  reports no history of alcohol use.  Tobacco use --  Tobacco Use: Low Risk  (05/15/2023)   Patient History    Smoking Tobacco Use: Never    Smokeless Tobacco Use: Never    Passive Exposure: Not on file    Eligible for Low Dose CT? No  UTD with eye doctor? No, wears glasses as needed. UTD with dentist? No     05/15/2023    2:40 PM  Depression screen PHQ 2/9  Decreased Interest 0  Down, Depressed, Hopeless 0  PHQ - 2 Score 0    Other providers/specialists: Patient Care Team: [name], [place] as PCP - General (Physician Assistant)    PMHx, SurgHx, SocialHx, Medications, and Allergies were reviewed in the Visit Navigator and updated as appropriate.   Past Medical History:  Diagnosis Date   Chronic back pain    Deaf    Hypertension     No past surgical history on file.   Family History  Problem Relation Age of Onset   Mental illness Mother    Diabetes Mother    Other Father 32       [place]   Diabetes Maternal Grandmother    Stroke Maternal Grandmother    Hypertension Maternal Grandmother    Cancer Maternal Grandfather        brain cancer   Other Paternal Grandfather        factory explosion   Hypertension Maternal Uncle  Prostate cancer Neg Hx    Colon cancer Neg Hx     Social History   Tobacco Use   Smoking status: Never   Smokeless tobacco: Never  Substance Use Topics   Alcohol use: Never   Drug use: Never    Review of Systems:   Review of Systems  Constitutional:  Negative for chills, fever, malaise/fatigue and weight loss.  HENT:  Negative for hearing loss, sinus pain and sore throat.   Respiratory:  Positive for cough and wheezing. Negative for hemoptysis and shortness of breath.   Cardiovascular:  Positive for leg swelling. Negative for chest pain, palpitations and PND.  Gastrointestinal:  Positive for heartburn. Negative for abdominal pain, constipation, diarrhea, nausea  and vomiting.  Genitourinary:  Negative for dysuria, frequency and urgency.  Musculoskeletal:  Negative for back pain, myalgias and neck pain.  Skin:  Negative for itching and rash.  Neurological:  Negative for dizziness, tingling, seizures and headaches.  Endo/Heme/Allergies:  Negative for polydipsia.  Psychiatric/Behavioral:  Negative for depression. The patient is not nervous/anxious.     Objective:    Vitals:   05/15/23 1437  BP: (!) 150/90  Pulse: 96  Temp: 98.2 F (36.8 C)  SpO2: 98%    Body mass index is 29.88 kg/m.  General  Alert, cooperative, no distress, appears stated age  Head:  Normocephalic, without obvious abnormality, atraumatic  Eyes:  PERRL, conjunctiva/corneas clear, EOM's intact, fundi benign, both eyes       Ears:  Normal TM's and external ear canals, both ears  Nose: Nares normal, septum midline, mucosa normal, no drainage or sinus tenderness  Throat: Lips, mucosa, and tongue normal; teeth and gums normal  Neck: Supple, symmetrical, trachea midline, no adenopathy;     thyroid:  No enlargement/tenderness/nodules; no carotid bruit or JVD  Back:   Symmetric, no curvature, ROM normal, no CVA tenderness  Lungs:   Clear to auscultation bilaterally, respirations unlabored  Chest wall:  No tenderness or deformity  Heart:  Regular rate and rhythm, S1 and S2 normal, no murmur, rub or gallop  Abdomen:   Soft, non-tender, bowel sounds active all four quadrants, no masses, no organomegaly  Extremities: Extremities normal, atraumatic, no cyanosis or edema  Prostate : Deferred   Skin: Skin color, texture, turgor normal, no rashes or lesions  Lymph nodes: Cervical, supraclavicular, and axillary nodes normal  Neurologic: CNII-XII grossly intact. Normal strength, sensation and reflexes throughout   AssessmentPlan:   Routine physical examination Today patient counseled on age appropriate routine health concerns for screening and prevention, each reviewed and up to  date or declined. Immunizations reviewed and up to date or declined. Labs ordered and reviewed. Risk factors for depression reviewed and negative. Hearing function and visual acuity are intact. ADLs screened and addressed as needed. Functional ability and level of safety reviewed and appropriate. Education, counseling and referrals performed based on assessed risks today. Patient provided with a copy of personalized plan for preventive services.  Essential hypertension Normotensive Continue amlodipine 5 mg daily Follow-up in 3 month(s), sooner if concerns  Obesity, unspecified class, unspecified obesity type, unspecified whether serious comorbidity present Continue efforts at walking and healthy eating Follow-up in 3 months, sooner if concerns  Pure hypercholesterolemia Update lipid panel and adjust zocor 40 mg daily as indicated  Mild persistent reactive airway disease without complication Advised as follows Increase QVAR to two puffs in AM and PM If no improvement, let me know and we will add in singulair medication Try an  OTC (available over the counter without a prescription) medication such as Prilosec or Nexium (GENERIC IS FINE) to see if this can help with your dry cough -- this is typically sold in two week courses  I,Alexander Ruley,acting as a scribe for [employer], PA.,have documented all relevant documentation on the behalf of [name], PA,as directed by  [name], PA while in the presence of [name], [place].  I, [name], [place], have reviewed all documentation for this visit. The documentation on 05/15/23 for the exam, diagnosis, procedures, and orders are all accurate and complete.     [name], PA-C [facility]

## 2023-05-14 ENCOUNTER — Encounter: Payer: 59 | Admitting: Physician Assistant

## 2023-05-15 ENCOUNTER — Ambulatory Visit (INDEPENDENT_AMBULATORY_CARE_PROVIDER_SITE_OTHER): Payer: 59 | Admitting: Physician Assistant

## 2023-05-15 ENCOUNTER — Encounter: Payer: Self-pay | Admitting: Physician Assistant

## 2023-05-15 VITALS — BP 137/88 | HR 96 | Temp 98.2°F | Ht 68.0 in | Wt 196.5 lb

## 2023-05-15 DIAGNOSIS — E7800 Pure hypercholesterolemia, unspecified: Secondary | ICD-10-CM | POA: Diagnosis not present

## 2023-05-15 DIAGNOSIS — E669 Obesity, unspecified: Secondary | ICD-10-CM

## 2023-05-15 DIAGNOSIS — I10 Essential (primary) hypertension: Secondary | ICD-10-CM | POA: Diagnosis not present

## 2023-05-15 DIAGNOSIS — J4530 Mild persistent asthma, uncomplicated: Secondary | ICD-10-CM

## 2023-05-15 DIAGNOSIS — Z0000 Encounter for general adult medical examination without abnormal findings: Secondary | ICD-10-CM | POA: Diagnosis not present

## 2023-05-15 MED ORDER — AMLODIPINE BESYLATE 5 MG PO TABS: 5.0000 mg | ORAL_TABLET | Freq: Every day | ORAL | 3 refills | Status: DC

## 2023-05-15 NOTE — Patient Instructions (Addendum)
It was great to see you!  Increase QVAR to two puffs in AM and PM If no improvement, let me know and we will add in singulair medication Try an OTC (available over the counter without a prescription) medication such as Prilosec or Nexium (GENERIC IS FINE) to see if this can help with your dry cough -- this is typically sold in two week courses Let's follow-up in February -- our goal is to have you under 190 pounds -- try to start walking  GOAL for you: Recommend moderate walking, 3-5 times/week for 30-50 minutes each session.  Aim for at least 150 minutes.week.  Goal should be pace of 3 miles/hours, or walking 1.5 miles in 30 minutes   Please go to the lab for blood work.   Our office will call you with your results unless you have chosen to receive results via MyChart.  If your blood work is normal we will follow-up each year for physicals and as scheduled for chronic medical problems.  If anything is abnormal we will treat accordingly and get you in for a follow-up.  Take care,  Lelon Mast

## 2023-05-16 ENCOUNTER — Encounter: Payer: Self-pay | Admitting: Physician Assistant

## 2023-05-16 ENCOUNTER — Other Ambulatory Visit: Payer: Self-pay | Admitting: Physician Assistant

## 2023-05-16 LAB — CBC WITH DIFFERENTIAL/PLATELET
Basophils Absolute: 0.1 10*3/uL (ref 0.0–0.1)
Basophils Relative: 1.3 % (ref 0.0–3.0)
Eosinophils Absolute: 0.3 10*3/uL (ref 0.0–0.7)
Eosinophils Relative: 2.5 % (ref 0.0–5.0)
HCT: 44.2 % (ref 39.0–52.0)
Hemoglobin: 15 g/dL (ref 13.0–17.0)
Lymphocytes Relative: 29.9 % (ref 12.0–46.0)
Lymphs Abs: 3.1 10*3/uL (ref 0.7–4.0)
MCHC: 33.8 g/dL (ref 30.0–36.0)
MCV: 86.5 fL (ref 78.0–100.0)
Monocytes Absolute: 0.7 10*3/uL (ref 0.1–1.0)
Monocytes Relative: 6.4 % (ref 3.0–12.0)
Neutro Abs: 6.3 10*3/uL (ref 1.4–7.7)
Neutrophils Relative %: 59.9 % (ref 43.0–77.0)
Platelets: 342 10*3/uL (ref 150.0–400.0)
RBC: 5.11 Mil/uL (ref 4.22–5.81)
RDW: 13.9 % (ref 11.5–15.5)
WBC: 10.4 10*3/uL (ref 4.0–10.5)

## 2023-05-16 LAB — LIPID PANEL
Cholesterol: 177 mg/dL (ref 0–200)
HDL: 41.8 mg/dL (ref >39.00)
LDL Cholesterol: 105 mg/dL — ABNORMAL HIGH (ref 0–99)
NonHDL: 135.2
Total CHOL/HDL Ratio: 4
Triglycerides: 151 mg/dL — ABNORMAL HIGH (ref 0.0–149.0)
VLDL: 30.2 mg/dL (ref 0.0–40.0)

## 2023-05-16 LAB — COMPREHENSIVE METABOLIC PANEL
ALT: 38 U/L (ref 0–53)
AST: 37 U/L (ref 0–37)
Albumin: 4.7 g/dL (ref 3.5–5.2)
Alkaline Phosphatase: 137 U/L — ABNORMAL HIGH (ref 39–117)
BUN: 13 mg/dL (ref 6–23)
CO2: 30 meq/L (ref 19–32)
Calcium: 9.8 mg/dL (ref 8.4–10.5)
Chloride: 100 meq/L (ref 96–112)
Creatinine, Ser: 0.95 mg/dL (ref 0.40–1.50)
GFR: 98.8 mL/min (ref >60.00)
Glucose, Bld: 79 mg/dL (ref 70–99)
Potassium: 3.5 meq/L (ref 3.5–5.1)
Sodium: 141 meq/L (ref 135–145)
Total Bilirubin: 0.7 mg/dL (ref 0.2–1.2)
Total Protein: 7.5 g/dL (ref 6.0–8.3)

## 2023-05-16 LAB — HEMOGLOBIN A1C: Hgb A1c MFr Bld: 5.7 % (ref 4.6–6.5)

## 2023-05-16 MED ORDER — SIMVASTATIN 80 MG PO TABS
80.0000 mg | ORAL_TABLET | Freq: Every day | ORAL | 3 refills | Status: DC
Start: 2023-05-16 — End: 2024-05-20

## 2023-05-17 ENCOUNTER — Other Ambulatory Visit: Payer: Self-pay | Admitting: Physician Assistant

## 2023-05-17 DIAGNOSIS — R748 Abnormal levels of other serum enzymes: Secondary | ICD-10-CM

## 2023-05-21 ENCOUNTER — Encounter: Payer: Self-pay | Admitting: Physician Assistant

## 2023-08-18 ENCOUNTER — Encounter: Payer: Self-pay | Admitting: Physician Assistant

## 2023-08-19 NOTE — Telephone Encounter (Signed)
 Noted

## 2023-08-21 ENCOUNTER — Ambulatory Visit: Payer: 59 | Admitting: Physician Assistant

## 2023-09-11 ENCOUNTER — Encounter: Payer: Self-pay | Admitting: Physician Assistant

## 2023-09-11 ENCOUNTER — Ambulatory Visit: Payer: 59 | Admitting: Physician Assistant

## 2023-09-11 VITALS — BP 150/94 | HR 88 | Temp 98.6°F | Ht 68.0 in | Wt 201.0 lb

## 2023-09-11 DIAGNOSIS — I10 Essential (primary) hypertension: Secondary | ICD-10-CM

## 2023-09-11 DIAGNOSIS — E7800 Pure hypercholesterolemia, unspecified: Secondary | ICD-10-CM | POA: Diagnosis not present

## 2023-09-11 DIAGNOSIS — R748 Abnormal levels of other serum enzymes: Secondary | ICD-10-CM

## 2023-09-11 DIAGNOSIS — E669 Obesity, unspecified: Secondary | ICD-10-CM

## 2023-09-11 DIAGNOSIS — E88819 Insulin resistance, unspecified: Secondary | ICD-10-CM | POA: Diagnosis not present

## 2023-09-11 DIAGNOSIS — J4520 Mild intermittent asthma, uncomplicated: Secondary | ICD-10-CM

## 2023-09-11 NOTE — Progress Notes (Signed)
 Thomas [name] is a 43 y.o. male here for a follow up of a pre-existing problem.  History of Present Illness:   Chief Complaint  Patient presents with   Hypertension    HPI  HTN  Reports compliance and good tolerance of amlodipine 5 mg daily.  Plans to improve his diet to manage his condition. He is agreeable to at home BP monitoring.  No concerns or symptoms at this time.  Hyperlipidemia Currently treated with simvastatin 80 mg daily. Tolerates this well.  Has gained some weight since his last office visit.  His goal weight is 190 pounds which he plans to achieve by his next PE in June. He has been adjusting his diet and reducing his red meat intake. Will recheck levels today along with A1c.   Reactive Airway Disease Treated with albuterol as needed. Last wheezing episode was around christmas. He denies any current episodes. Previous cough has also resolved since then.   Alkaline phosphatase This was elevated and he had previous workup Referred to endocrinology back in November but he has yet to hear anything about this appointment  Past Medical History:  Diagnosis Date   Chronic back pain    Deaf    Hypertension      Social History   Tobacco Use   Smoking status: Never   Smokeless tobacco: Never  Substance Use Topics   Alcohol use: Never   Drug use: Never    No past surgical history on file.  Family History  Problem Relation Age of Onset   Mental illness Mother    Diabetes Mother    Other Father 69       [place]   Diabetes Maternal Grandmother    Stroke Maternal Grandmother    Hypertension Maternal Grandmother    Cancer Maternal Grandfather        brain cancer   Other Paternal Grandfather        factory explosion   Hypertension Maternal Uncle    Prostate cancer Neg Hx    Colon cancer Neg Hx     No Known Allergies  Current Medications:   Current Outpatient Medications:    albuterol  (PROVENTIL HFA;VENTOLIN HFA) 108 (90 Base) MCG/ACT inhaler, Inhale 2 puffs into the lungs every 6 (six) hours as needed for wheezing or shortness of breath., Disp: 1 Inhaler, Rfl: 0   amLODipine (NORVASC) 5 MG tablet, Take 1 tablet (5 mg total) by mouth daily., Disp: 90 tablet, Rfl: 3   fluticasone-salmeterol (WIXELA INHUB) 250-50 MCG/ACT AEPB, Inhale 1 puff into the lungs in the morning and at bedtime., Disp: , Rfl:    ibuprofen (ADVIL) 400 MG tablet, Take 1 tablet (400 mg total) by mouth every 8 (eight) hours as needed for moderate pain., Disp: 30 tablet, Rfl: 0   simvastatin (ZOCOR) 80 MG tablet, Take 1 tablet (80 mg total) by mouth daily., Disp: 90 tablet, Rfl: 3   Review of Systems:   ROS Negative unless otherwise specified per HPI.  Vitals:   Vitals:   09/11/23 1439 09/11/23 1510  BP: (!) 150/90 (!) 150/94  Pulse: 88   Temp: 98.6 F (37 C)   TempSrc: Temporal   SpO2: 97%   Weight: 201 lb (91.2 kg)   Height: 5\' 8"  (1.727 m)      Body mass index is 30.56 kg/m.  Physical Exam:   Physical Exam Vitals and nursing note reviewed.  Constitutional:  General: He is not in acute distress.    Appearance: He is well-developed. He is not ill-appearing or toxic-appearing.  Cardiovascular:     Rate and Rhythm: Normal rate and regular rhythm.     Pulses: Normal pulses.     Heart sounds: Normal heart sounds, S1 normal and S2 normal.  Pulmonary:     Effort: Pulmonary effort is normal.     Breath sounds: Normal breath sounds.  Skin:    General: Skin is warm and dry.  Neurological:     Mental Status: He is alert.     GCS: GCS eye subscore is 4. GCS verbal subscore is 5. GCS motor subscore is 6.  Psychiatric:        Speech: Speech normal.        Behavior: Behavior normal. Behavior is cooperative.     Assessment and Plan:   Pure hypercholesterolemia Update lipid panel and adjust statin accordingly, discussed that I may need to change to high intensity statin such as Crestor  if cholesterol still not at goal  Insulin resistance Update A1c and provide recommendations accordingly  Essential hypertension Above goal today No evidence of end-organ damage on my exam Recommend patient monitor home blood pressure at least a few times weekly Continue amlodipine 5 mg daily If home monitoring shows consistent elevation, or any symptom(s) develop, recommend reach out to [place] for further advice on next steps  Obesity, unspecified class, unspecified obesity type, unspecified whether serious comorbidity present He is encouraged to start exercising now that the weather is warmer Continue efforts at healthy lifestyle  Mild intermittent reactive airway disease without complication Denies any major concerns and is now taking his inhaler as needed Previous cough has ceased  Alkaline phosphatase elevation I have reached out to my referral coordinator to ask about status of this referral    Thomas Motto, Thomas [name]  I,Thomas [name],acting as a scribe for Thomas Motto, PA.,have documented all relevant documentation on the behalf of Thomas Motto, PA,as directed by  Thomas Motto, PA while in the presence of Thomas [name], [place].   I, Thomas [name], [place], have reviewed all documentation for this visit. The documentation on 09/11/23 for the exam, diagnosis, procedures, and orders are all accurate and complete.

## 2023-09-11 NOTE — Patient Instructions (Addendum)
 It was great to see you!  Exercise, exercise, exercise!!  Your blood pressure is elevated in our office today.  I recommend that you monitor this at home.  Your goal blood pressure should be around < 130/80, unless you are over 43 years old, your goal may be closer to 140-150/90. Please note if you have been given other goals from a cardiologist or other healthcare provider, please defer to their recommendations.  When preparing to take your blood pressure: Plan ahead. Don't smoke, drink caffeine or exercise within 30 minutes before taking your blood pressure. Empty your bladder. Don't take the measurement over clothes. Remove the clothing over the arm that will be used to measure blood pressure. You can use either arm unless otherwise told by a healthcare provider. Usually there is not a big difference between readings on them. Be still. Allow at least five minutes of quiet rest before measurements. Don't talk or use the phone. Sit correctly. Sit with your back straight and supported (on a dining chair, rather than a sofa). Your feet should be flat on the floor. Do not cross your legs. Support your arm on a flat surface. The middle of the cuff should be placed on the upper arm at heart level.  Measure at the same time of the day. Take multiple readings and record the results. Each time you measure, take two readings one minute apart. Record the results and bring in to your next office visit.  In order to know how well the medication is working, I would like you to take your readings 1-2 hours after taking your blood pressure medication if possible. Take your blood pressure measurements and record 2-3 days per week.  If you get a high blood pressure reading: A single high reading is not an immediate cause for alarm. If you get a reading that is higher than normal, take your blood pressure a second time. Write down the results of both measurements. Check with your health care professional to see  if there's a health concern or whether there may be problems with your monitor. If your blood pressure readings are suddenly higher than 180/120 mm Hg, wait at least one minute and test again. If your readings are still very high, contact your health care professional immediately. You could be having a hypertensive crisis. Call 911 if your blood pressure is higher than 180/120 mm Hg and if you are having new signs or symptoms that may include: Chest pain Shortness of breath Back pain Numbness Weakness Change in vision Difficulty speaking Confusion Dizziness Vomiting   Take care,  [name] PA-C   .

## 2023-09-12 ENCOUNTER — Encounter: Payer: Self-pay | Admitting: Physician Assistant

## 2023-09-12 LAB — COMPREHENSIVE METABOLIC PANEL
ALT: 23 U/L (ref 0–53)
AST: 19 U/L (ref 0–37)
Albumin: 4.6 g/dL (ref 3.5–5.2)
Alkaline Phosphatase: 136 U/L — ABNORMAL HIGH (ref 39–117)
BUN: 13 mg/dL (ref 6–23)
CO2: 31 meq/L (ref 19–32)
Calcium: 9.8 mg/dL (ref 8.4–10.5)
Chloride: 102 meq/L (ref 96–112)
Creatinine, Ser: 0.88 mg/dL (ref 0.40–1.50)
GFR: 105.89 mL/min (ref >60.00)
Glucose, Bld: 121 mg/dL — ABNORMAL HIGH (ref 70–99)
Potassium: 3.3 meq/L — ABNORMAL LOW (ref 3.5–5.1)
Sodium: 142 meq/L (ref 135–145)
Total Bilirubin: 0.4 mg/dL (ref 0.2–1.2)
Total Protein: 7.2 g/dL (ref 6.0–8.3)

## 2023-09-12 LAB — LIPID PANEL
Cholesterol: 145 mg/dL (ref 0–200)
HDL: 35.6 mg/dL — ABNORMAL LOW (ref >39.00)
LDL Cholesterol: 62 mg/dL (ref 0–99)
NonHDL: 109.28
Total CHOL/HDL Ratio: 4
Triglycerides: 236 mg/dL — ABNORMAL HIGH (ref 0.0–149.0)
VLDL: 47.2 mg/dL — ABNORMAL HIGH (ref 0.0–40.0)

## 2023-09-12 LAB — HEMOGLOBIN A1C: Hgb A1c MFr Bld: 5.5 % (ref 4.6–6.5)

## 2023-12-02 ENCOUNTER — Ambulatory Visit: Admitting: "Endocrinology

## 2023-12-25 ENCOUNTER — Encounter: Payer: Self-pay | Admitting: Physician Assistant

## 2023-12-25 ENCOUNTER — Ambulatory Visit: Admitting: Physician Assistant

## 2023-12-25 VITALS — BP 146/90 | HR 87 | Temp 98.6°F | Ht 68.0 in | Wt 198.0 lb

## 2023-12-25 DIAGNOSIS — Z3009 Encounter for other general counseling and advice on contraception: Secondary | ICD-10-CM

## 2023-12-25 DIAGNOSIS — E669 Obesity, unspecified: Secondary | ICD-10-CM | POA: Diagnosis not present

## 2023-12-25 DIAGNOSIS — I10 Essential (primary) hypertension: Secondary | ICD-10-CM

## 2023-12-25 DIAGNOSIS — E876 Hypokalemia: Secondary | ICD-10-CM | POA: Diagnosis not present

## 2023-12-25 NOTE — Patient Instructions (Addendum)
 It was great to see you!  We will recheck your potassium and I will advise accordingly  Urology referral   Anytime after 05/14/24 we can do your annual exam!   Have a good rest of the summer!  Take care,  Lucie Buttner PA-C

## 2023-12-25 NOTE — Progress Notes (Signed)
 Thomas [name] is a 43 y.o. male here for a follow up of a pre-existing problem.  History of Present Illness:   Chief Complaint  Patient presents with   Hypertension    Pt has been checking Bp at home every other day, 138/80.     HPI - An interpreter is present.  Obesity / Weight management Pt states Thomas [name] needs to do better with weight management, but his weight was down to 192 lb prior to his birthday celebration 1-2 weeks ago. Thomas [name] states Thomas [name] uses his an app on his watch to remind him to walk more. Thomas [name] walks 20-24 minutes and reports improved breathing ever since Thomas [name] started walking more. Endorses better water intake, walking more, cutting out soda and bread Thomas [name] notes Thomas [name] had to cancel and reschedule his endocrinology referral.  HTN Pt is on Amlodipine  5 mg daily. Good compliance and tolerance. HTN is well-managed. Thomas [name] checks his BP at home with a recent reading of 130/82. No acute concerns reported today.  Hypokalemia Thomas [name] also notes Thomas [name] has been taking more potassium because it was low. Thomas [name] is tolerating it well and is agreeable to continuing these supplements over banana intake.  Vasectomy evaluation Pt is inquiring about a vasectomy referral. Thomas [name] does have a concern about missing too much work. Thomas [name] notes his job is not physical. Thomas [name] is agreeable to getting his referral today.  Past Medical History:  Diagnosis Date   Chronic back pain    Deaf    Hypertension      Social History   Tobacco Use   Smoking status: Never   Smokeless tobacco: Never  Substance Use Topics   Alcohol use: Never   Drug use: Never    No past surgical history on file.  Family History  Problem Relation Age of Onset   Mental illness Mother    Diabetes Mother    Other Father 58       [place]   Diabetes Maternal Grandmother    Stroke Maternal Grandmother    Hypertension Maternal Grandmother    Cancer Maternal Grandfather        brain cancer    Diabetes Paternal Grandmother    Hypertension Paternal Grandmother    Stroke Paternal Grandmother    Other Paternal Grandfather        factory explosion   Hypertension Maternal Uncle    Prostate cancer Neg Hx    Colon cancer Neg Hx     No Known Allergies  Current Medications:   Current Outpatient Medications:    albuterol  (PROVENTIL  HFA;VENTOLIN  HFA) 108 (90 Base) MCG/ACT inhaler, Inhale 2 puffs into the lungs every 6 (six) hours as needed for wheezing or shortness of breath., Disp: 1 Inhaler, Rfl: 0   amLODipine  (NORVASC ) 5 MG tablet, Take 1 tablet (5 mg total) by mouth daily., Disp: 90 tablet, Rfl: 3   fluticasone -salmeterol (WIXELA INHUB) 250-50 MCG/ACT AEPB, Inhale 1 puff into the lungs in the morning and at bedtime., Disp: , Rfl:    simvastatin  (ZOCOR ) 80 MG tablet, Take 1 tablet (80 mg total) by mouth daily., Disp: 90 tablet, Rfl: 3   Review of Systems:   Negative unless otherwise specified per HPI.  Vitals:   Vitals:   12/25/23 1454 12/25/23 1543  BP: (!) 150/86 (!) 146/90  Pulse: 87   Temp: 98.6 F (37 C)   TempSrc: Temporal   SpO2: 96%   Weight: 198 lb (89.8 kg)  Height: 5' 8 (1.727 m)      Body mass index is 30.11 kg/m.  Physical Exam:   Physical Exam Vitals and nursing note reviewed.  Constitutional:      General: Thomas [name] is not in acute distress.    Appearance: Thomas [name] is well-developed. Thomas [name] is not ill-appearing or toxic-appearing.   Cardiovascular:     Rate and Rhythm: Normal rate and regular rhythm.     Pulses: Normal pulses.     Heart sounds: Normal heart sounds, S1 normal and S2 normal.  Pulmonary:     Effort: Pulmonary effort is normal.     Breath sounds: Normal breath sounds.   Skin:    General: Skin is warm and dry.   Neurological:     Mental Status: Thomas [name] is alert.     GCS: GCS eye subscore is 4. GCS verbal subscore is 5. GCS motor subscore is 6.   Psychiatric:        Speech: Speech normal.        Behavior: Behavior normal. Behavior is  cooperative.     Assessment and Plan:   1. Obesity, unspecified class, unspecified obesity type, unspecified whether serious comorbidity present (Primary)  Continue efforts at healthy diet and lifestyle Follow-up in November for CPE  2. Essential hypertension - Basic Metabolic Panel (BMET)  Above goal today No evidence of end-organ damage on my exam Recommend patient monitor home blood pressure at least a few times weekly Continue amlodipine  5 mg daily If home monitoring shows consistent elevation, or any symptom(s) develop, recommend reach out to us  for further advice on next steps Follow-up in November for CPE  3. Hypokalemia  Update potassium and provide recommendations on supplementation accordingly  4. Vasectomy evaluation - Ambulatory referral to Urology    I, Lavern Simmers, acting as a Neurosurgeon for Lucie Buttner, [place]., have documented all relevant documentation on the behalf of Lucie Buttner, [place], as directed by Lucie Buttner, PA while in the presence of Lucie Buttner, [place].  I, Lucie Buttner, [place], have reviewed all documentation for this visit. The documentation on 12/25/23 for the exam, diagnosis, procedures, and orders are all accurate and complete.  Lucie Buttner, PA-C

## 2023-12-26 LAB — BASIC METABOLIC PANEL WITH GFR
BUN: 11 mg/dL (ref 6–23)
CO2: 32 meq/L (ref 19–32)
Calcium: 9.6 mg/dL (ref 8.4–10.5)
Chloride: 98 meq/L (ref 96–112)
Creatinine, Ser: 0.9 mg/dL (ref 0.40–1.50)
GFR: 104.97 mL/min (ref >60.00)
Glucose, Bld: 106 mg/dL — ABNORMAL HIGH (ref 70–99)
Potassium: 3.4 meq/L — ABNORMAL LOW (ref 3.5–5.1)
Sodium: 140 meq/L (ref 135–145)

## 2023-12-29 ENCOUNTER — Ambulatory Visit: Payer: Self-pay | Admitting: Physician Assistant

## 2024-01-13 ENCOUNTER — Encounter: Payer: Self-pay | Admitting: Physician Assistant

## 2024-01-20 ENCOUNTER — Ambulatory Visit: Admitting: "Endocrinology

## 2024-01-25 ENCOUNTER — Other Ambulatory Visit: Payer: Self-pay | Admitting: Physician Assistant

## 2024-02-19 ENCOUNTER — Encounter: Payer: Self-pay | Admitting: "Endocrinology

## 2024-02-19 ENCOUNTER — Ambulatory Visit: Admitting: "Endocrinology

## 2024-02-19 VITALS — BP 140/80 | HR 84 | Ht 68.0 in | Wt 198.0 lb

## 2024-02-19 DIAGNOSIS — R748 Abnormal levels of other serum enzymes: Secondary | ICD-10-CM

## 2024-02-19 NOTE — Progress Notes (Signed)
 Outpatient Endocrinology Note Thomas Birmingham, MD    Thomas [name] Sep 23, 1980 [id number]  Referring Provider: Job Lukes, PA Primary Care Provider: Job Lukes, PA Reason for consultation: Subjective   Assessment & Plan  Diagnoses and all orders for this visit:  Alkaline phosphatase elevation -     Comprehensive metabolic panel with GFR   No known etiology for elevated alkaline phophatase which has been noted to be high at least since 12/2017  Has history of gallstones without elevated liver enzymes  Fasting helps ensure that the results are accurate and not affected by food or drink. Eating or drinking before the test can increase ALP levels, leading to inaccurate results.  Recommend repeat blood work after 10-12 hours of fasting    Return in about 3 months (around 05/21/2024) for visit and 8 am labs before next visit, 8 am labs this week .   I have reviewed current medications, nurse's notes, allergies, vital signs, past medical and surgical history, family medical history, and social history for this encounter. Counseled patient on symptoms, examination findings, lab findings, imaging results, treatment decisions and monitoring and prognosis. The patient understood the recommendations and agrees with the treatment plan. All questions regarding treatment plan were fully answered.  Thomas Birmingham, MD  02/19/24   History of Present Illness HPI  Thomas [name] is a 43 y.o. year old male who presents for evaluation of elevated alkaline phosphatase.  All conversation achieved with a sign language translator  Has history of gallstones No known Liver diseases like hepatitis, cirrhosis, and bile duct obstruction  No known conditions affecting bone growth and breakdown, like Paget's disease of bone, osteomalacia, and hyperparathyroidism No known cancers/medications increasing alkaline phosphatase   Physical Exam  BP (!) 140/80    Pulse 84   Ht 5' 8 (1.727 m)   Wt 198 lb (89.8 kg)   SpO2 96%   BMI 30.11 kg/m    Constitutional: well developed, well nourished Head: normocephalic, atraumatic Eyes: sclera anicteric, no redness Neck: supple Lungs: normal respiratory effort Neurology: alert and oriented Skin: dry, no appreciable rashes Musculoskeletal: no appreciable defects Psychiatric: normal mood and affect   Current Medications Patient's Medications  New Prescriptions   No medications on file  Previous Medications   ALBUTEROL  (PROVENTIL  HFA;VENTOLIN  HFA) 108 (90 BASE) MCG/ACT INHALER    Inhale 2 puffs into the lungs every 6 (six) hours as needed for wheezing or shortness of breath.   AMLODIPINE  (NORVASC ) 5 MG TABLET    TAKE 1 TABLET (5 MG TOTAL) BY MOUTH DAILY.   FLUTICASONE -SALMETEROL (WIXELA INHUB) 250-50 MCG/ACT AEPB    Inhale 1 puff into the lungs in the morning and at bedtime.   SIMVASTATIN  (ZOCOR ) 80 MG TABLET    Take 1 tablet (80 mg total) by mouth daily.  Modified Medications   No medications on file  Discontinued Medications   No medications on file    Allergies No Known Allergies  Past Medical History Past Medical History:  Diagnosis Date   Chronic back pain    Deaf    Hypertension     Past Surgical History No past surgical history on file.  Family History family history includes Cancer in his maternal grandfather; Diabetes in his maternal grandmother, mother, and paternal grandmother; Hypertension in his maternal grandmother, maternal uncle, and paternal grandmother; Mental illness in his mother; Other in his paternal grandfather; Other (age of onset: 86) in his father; Stroke in his maternal grandmother and paternal  grandmother.  Social History Social History   Socioeconomic History   Marital status: Married    Spouse name: Not on file   Number of children: Not on file   Years of education: Not on file   Highest education level: Not on file  Occupational History   Not on  file  Tobacco Use   Smoking status: Never   Smokeless tobacco: Never  Substance and Sexual Activity   Alcohol use: Never   Drug use: Never   Sexual activity: Yes    Partners: Female    Birth control/protection: None  Other Topics Concern   Not on file  Social History Narrative   Not on file   Social Drivers of Health   Financial Resource Strain: Not on file  Food Insecurity: Not on file  Transportation Needs: Not on file  Physical Activity: Not on file  Stress: Not on file  Social Connections: Not on file  Intimate Partner Violence: Not on file    Lab Results  Component Value Date   CHOL 145 09/11/2023   Lab Results  Component Value Date   HDL 35.60 (L) 09/11/2023   Lab Results  Component Value Date   LDLCALC 62 09/11/2023   Lab Results  Component Value Date   TRIG 236.0 (H) 09/11/2023   Lab Results  Component Value Date   CHOLHDL 4 09/11/2023   Lab Results  Component Value Date   CREATININE 0.90 12/25/2023   Lab Results  Component Value Date   GFR 104.97 12/25/2023      Component Value Date/Time   NA 140 12/25/2023 1545   NA 142 08/07/2013 0000   K 3.4 (L) 12/25/2023 1545   CL 98 12/25/2023 1545   CO2 32 12/25/2023 1545   GLUCOSE 106 (H) 12/25/2023 1545   BUN 11 12/25/2023 1545   BUN 13 08/07/2013 0000   CREATININE 0.90 12/25/2023 1545   CALCIUM 9.6 12/25/2023 1545   PROT 7.2 09/11/2023 1510   ALBUMIN 4.6 09/11/2023 1510   AST 19 09/11/2023 1510   ALT 23 09/11/2023 1510   ALKPHOS 136 (H) 09/11/2023 1510   BILITOT 0.4 09/11/2023 1510   GFRNONAA >60 01/03/2022 0748      Latest Ref Rng & Units 12/25/2023    3:45 PM 09/11/2023    3:10 PM 05/15/2023    3:21 PM  BMP  Glucose 70 - 99 mg/dL 893  878  79   BUN 6 - 23 mg/dL 11  13  13    Creatinine 0.40 - 1.50 mg/dL 9.09  9.11  9.04   Sodium 135 - 145 mEq/L 140  142  141   Potassium 3.5 - 5.1 mEq/L 3.4  3.3  3.5   Chloride 96 - 112 mEq/L 98  102  100   CO2 19 - 32 mEq/L 32  31  30   Calcium  8.4 - 10.5 mg/dL 9.6  9.8  9.8        Component Value Date/Time   WBC 10.4 05/15/2023 1521   RBC 5.11 05/15/2023 1521   HGB 15.0 05/15/2023 1521   HCT 44.2 05/15/2023 1521   PLT 342.0 05/15/2023 1521   MCV 86.5 05/15/2023 1521   MCH 28.6 01/03/2022 0748   MCHC 33.8 05/15/2023 1521   RDW 13.9 05/15/2023 1521   LYMPHSABS 3.1 05/15/2023 1521   MONOABS 0.7 05/15/2023 1521   EOSABS 0.3 05/15/2023 1521   BASOSABS 0.1 05/15/2023 1521   Lab Results  Component Value Date   TSH  1.46 09/10/2016   TSH 1.51 08/07/2013   FREET4 0.90 09/10/2016         Parts of this note may have been dictated using voice recognition software. There may be variances in spelling and vocabulary which are unintentional. Not all errors are proofread. Please notify the dino if any discrepancies are noted or if the meaning of any statement is not clear.

## 2024-02-21 ENCOUNTER — Other Ambulatory Visit

## 2024-02-21 LAB — COMPREHENSIVE METABOLIC PANEL WITH GFR
AG Ratio: 2 (calc) (ref 1.0–2.5)
ALT: 27 U/L (ref 9–46)
AST: 20 U/L (ref 10–40)
Albumin: 4.7 g/dL (ref 3.6–5.1)
Alkaline phosphatase (APISO): 151 U/L — ABNORMAL HIGH (ref 36–130)
BUN: 12 mg/dL (ref 7–25)
CO2: 31 mmol/L (ref 20–32)
Calcium: 9.7 mg/dL (ref 8.6–10.3)
Chloride: 102 mmol/L (ref 98–110)
Creat: 0.88 mg/dL (ref 0.60–1.29)
Globulin: 2.4 g/dL (ref 1.9–3.7)
Glucose, Bld: 100 mg/dL — ABNORMAL HIGH (ref 65–99)
Potassium: 3.6 mmol/L (ref 3.5–5.3)
Sodium: 142 mmol/L (ref 135–146)
Total Bilirubin: 0.7 mg/dL (ref 0.2–1.2)
Total Protein: 7.1 g/dL (ref 6.1–8.1)
eGFR: 109 mL/min/1.73m2 (ref >=60)

## 2024-02-26 ENCOUNTER — Other Ambulatory Visit: Payer: Self-pay | Admitting: "Endocrinology

## 2024-02-26 DIAGNOSIS — R748 Abnormal levels of other serum enzymes: Secondary | ICD-10-CM

## 2024-05-06 ENCOUNTER — Other Ambulatory Visit

## 2024-05-09 LAB — ALKALINE PHOSPHATASE ISOENZYMES
Alkaline phosphatase (APISO): 158 U/L — ABNORMAL HIGH (ref 36–130)
Bone Isoenzymes: 22 % — ABNORMAL LOW (ref 28–66)
Intestinal Isoenzymes: 0 % — ABNORMAL LOW (ref 1–24)
Liver Isoenzymes: 78 % — ABNORMAL HIGH (ref 25–69)
Macrohepatic isoenzymes: 0 % (ref <=0)
Placental isoenzymes: 0 % (ref <=0)

## 2024-05-09 LAB — GAMMA GT: GGT: 19 U/L (ref 3–95)

## 2024-05-13 ENCOUNTER — Ambulatory Visit: Admitting: "Endocrinology

## 2024-05-13 ENCOUNTER — Encounter: Payer: Self-pay | Admitting: "Endocrinology

## 2024-05-13 VITALS — BP 134/80 | HR 89 | Ht 68.0 in | Wt 199.0 lb

## 2024-05-13 DIAGNOSIS — R748 Abnormal levels of other serum enzymes: Secondary | ICD-10-CM

## 2024-05-13 NOTE — Progress Notes (Signed)
 Outpatient Endocrinology Note Thomas Birmingham, MD    Thomas [name] Jan 18, 1981 [id number]  Referring Provider: Job Lukes, PA Primary Care Provider: Job Lukes, PA Reason for consultation: Subjective   Assessment & Plan  Diagnoses and all orders for this visit:  Alkaline phosphatase elevation -     Comprehensive metabolic panel with GFR -     NM Bone Scan Whole Body; Future -     NM Bone Scan Whole Body   No known etiology for elevated alkaline phophatase which has been noted to be high at least since 12/2017  Has history of gallstones without elevated liver enzymes  Fasting helps ensure that the results are accurate and not affected by food or drink. Eating or drinking before the test can increase ALP levels, leading to inaccurate results.  Recommend repeat blood work after 10-12 hours of fasting: still elevated, with liver component high but gamma GT/AST/ALT/bilirubin/albumin WNL Ordered bone scan to r/o paget's disease, if normal scan will continue to monitor levels Q6 mo  Answered all questions at length  Return in about 6 months (around 11/10/2024) for visit and 8 am labs before next visit.   I have reviewed current medications, nurse's notes, allergies, vital signs, past medical and surgical history, family medical history, and social history for this encounter. Counseled patient on symptoms, examination findings, lab findings, imaging results, treatment decisions and monitoring and prognosis. The patient understood the recommendations and agrees with the treatment plan. All questions regarding treatment plan were fully answered.  Thomas Birmingham, MD  05/13/24   History of Present Illness HPI  Thomas [name] is a 43 y.o. year old male who presents for follow up of elevated alkaline phosphatase.  Denies any abdominal pain/bone pain Denies any falls/fractures Takes calcium in MVI and Vit D 1000 units a day  All conversation  achieved with a sign presenter, broadcasting   Initial history:  All conversation achieved with a sign language translator  Has history of gallstones No known Liver diseases like hepatitis, cirrhosis, and bile duct obstruction  No known conditions affecting bone growth and breakdown, like Paget's disease of bone, osteomalacia, and hyperparathyroidism No known cancers/medications increasing alkaline phosphatase   Physical Exam  BP 134/80   Pulse 89   Ht 5' 8 (1.727 m)   Wt 199 lb (90.3 kg)   SpO2 98%   BMI 30.26 kg/m    Constitutional: well developed, well nourished Head: normocephalic, atraumatic Eyes: sclera anicteric, no redness Neck: supple Lungs: normal respiratory effort Neurology: alert and oriented Skin: dry, no appreciable rashes Musculoskeletal: no appreciable defects Psychiatric: normal mood and affect   Current Medications Patient's Medications  New Prescriptions   No medications on file  Previous Medications   ALBUTEROL  (PROVENTIL  HFA;VENTOLIN  HFA) 108 (90 BASE) MCG/ACT INHALER    Inhale 2 puffs into the lungs every 6 (six) hours as needed for wheezing or shortness of breath.   AMLODIPINE  (NORVASC ) 5 MG TABLET    TAKE 1 TABLET (5 MG TOTAL) BY MOUTH DAILY.   FLUTICASONE -SALMETEROL (WIXELA INHUB) 250-50 MCG/ACT AEPB    Inhale 1 puff into the lungs in the morning and at bedtime.   SIMVASTATIN  (ZOCOR ) 80 MG TABLET    Take 1 tablet (80 mg total) by mouth daily.  Modified Medications   No medications on file  Discontinued Medications   No medications on file    Allergies No Known Allergies  Past Medical History Past Medical History:  Diagnosis Date  Chronic back pain    Deaf    Hypertension     Past Surgical History History reviewed. No pertinent surgical history.  Family History family history includes Cancer in his maternal grandfather; Diabetes in his maternal grandmother, mother, and paternal grandmother; Hypertension in his maternal grandmother,  maternal uncle, and paternal grandmother; Mental illness in his mother; Other in his paternal grandfather; Other (age of onset: 47) in his father; Stroke in his maternal grandmother and paternal grandmother.  Social History Social History   Socioeconomic History   Marital status: Married    Spouse name: Not on file   Number of children: Not on file   Years of education: Not on file   Highest education level: Not on file  Occupational History   Not on file  Tobacco Use   Smoking status: Never   Smokeless tobacco: Never  Substance and Sexual Activity   Alcohol use: Never   Drug use: Never   Sexual activity: Yes    Partners: Female    Birth control/protection: None  Other Topics Concern   Not on file  Social History Narrative   Not on file   Social Drivers of Health   Financial Resource Strain: Not on file  Food Insecurity: Not on file  Transportation Needs: Not on file  Physical Activity: Not on file  Stress: Not on file  Social Connections: Not on file  Intimate Partner Violence: Not on file    Lab Results  Component Value Date   CHOL 145 09/11/2023   Lab Results  Component Value Date   HDL 35.60 (L) 09/11/2023   Lab Results  Component Value Date   LDLCALC 62 09/11/2023   Lab Results  Component Value Date   TRIG 236.0 (H) 09/11/2023   Lab Results  Component Value Date   CHOLHDL 4 09/11/2023   Lab Results  Component Value Date   CREATININE 0.88 02/21/2024   Lab Results  Component Value Date   GFR 104.97 12/25/2023      Component Value Date/Time   NA 142 02/21/2024 0750   NA 142 08/07/2013 0000   K 3.6 02/21/2024 0750   CL 102 02/21/2024 0750   CO2 31 02/21/2024 0750   GLUCOSE 100 (H) 02/21/2024 0750   BUN 12 02/21/2024 0750   BUN 13 08/07/2013 0000   CREATININE 0.88 02/21/2024 0750   CALCIUM 9.7 02/21/2024 0750   PROT 7.1 02/21/2024 0750   ALBUMIN 4.6 09/11/2023 1510   AST 20 02/21/2024 0750   ALT 27 02/21/2024 0750   ALKPHOS 136 (H)  09/11/2023 1510   BILITOT 0.7 02/21/2024 0750   GFRNONAA >60 01/03/2022 0748      Latest Ref Rng & Units 02/21/2024    7:50 AM 12/25/2023    3:45 PM 09/11/2023    3:10 PM  BMP  Glucose 65 - 99 mg/dL 899  893  878   BUN 7 - 25 mg/dL 12  11  13    Creatinine 0.60 - 1.29 mg/dL 9.11  9.09  9.11   BUN/Creat Ratio 6 - 22 (calc) SEE NOTE:     Sodium 135 - 146 mmol/L 142  140  142   Potassium 3.5 - 5.3 mmol/L 3.6  3.4  3.3   Chloride 98 - 110 mmol/L 102  98  102   CO2 20 - 32 mmol/L 31  32  31   Calcium 8.6 - 10.3 mg/dL 9.7  9.6  9.8        Component Value  Date/Time   WBC 10.4 05/15/2023 1521   RBC 5.11 05/15/2023 1521   HGB 15.0 05/15/2023 1521   HCT 44.2 05/15/2023 1521   PLT 342.0 05/15/2023 1521   MCV 86.5 05/15/2023 1521   MCH 28.6 01/03/2022 0748   MCHC 33.8 05/15/2023 1521   RDW 13.9 05/15/2023 1521   LYMPHSABS 3.1 05/15/2023 1521   MONOABS 0.7 05/15/2023 1521   EOSABS 0.3 05/15/2023 1521   BASOSABS 0.1 05/15/2023 1521   Lab Results  Component Value Date   TSH 1.46 09/10/2016   TSH 1.51 08/07/2013   FREET4 0.90 09/10/2016         Parts of this note may have been dictated using voice recognition software. There may be variances in spelling and vocabulary which are unintentional. Not all errors are proofread. Please notify the dino if any discrepancies are noted or if the meaning of any statement is not clear.

## 2024-05-20 ENCOUNTER — Encounter: Payer: Self-pay | Admitting: Physician Assistant

## 2024-05-20 ENCOUNTER — Ambulatory Visit: Admitting: Physician Assistant

## 2024-05-20 VITALS — BP 150/94 | HR 71 | Temp 98.6°F | Ht 68.0 in | Wt 196.2 lb

## 2024-05-20 DIAGNOSIS — I10 Essential (primary) hypertension: Secondary | ICD-10-CM | POA: Diagnosis not present

## 2024-05-20 DIAGNOSIS — E7800 Pure hypercholesterolemia, unspecified: Secondary | ICD-10-CM

## 2024-05-20 DIAGNOSIS — E88819 Insulin resistance, unspecified: Secondary | ICD-10-CM | POA: Diagnosis not present

## 2024-05-20 DIAGNOSIS — J4520 Mild intermittent asthma, uncomplicated: Secondary | ICD-10-CM

## 2024-05-20 DIAGNOSIS — Z0000 Encounter for general adult medical examination without abnormal findings: Secondary | ICD-10-CM | POA: Diagnosis not present

## 2024-05-20 DIAGNOSIS — Z23 Encounter for immunization: Secondary | ICD-10-CM | POA: Diagnosis not present

## 2024-05-20 DIAGNOSIS — E663 Overweight: Secondary | ICD-10-CM

## 2024-05-20 LAB — COMPREHENSIVE METABOLIC PANEL WITH GFR
ALT: 31 U/L (ref 0–53)
AST: 28 U/L (ref 0–37)
Albumin: 4.9 g/dL (ref 3.5–5.2)
Alkaline Phosphatase: 140 U/L — ABNORMAL HIGH (ref 39–117)
BUN: 12 mg/dL (ref 6–23)
CO2: 32 meq/L (ref 19–32)
Calcium: 9.4 mg/dL (ref 8.4–10.5)
Chloride: 98 meq/L (ref 96–112)
Creatinine, Ser: 0.86 mg/dL (ref 0.40–1.50)
GFR: 106.12 mL/min (ref >60.00)
Glucose, Bld: 82 mg/dL (ref 70–99)
Potassium: 3.1 meq/L — ABNORMAL LOW (ref 3.5–5.1)
Sodium: 142 meq/L (ref 135–145)
Total Bilirubin: 0.8 mg/dL (ref 0.2–1.2)
Total Protein: 7.4 g/dL (ref 6.0–8.3)

## 2024-05-20 LAB — LIPID PANEL
Cholesterol: 150 mg/dL (ref 0–200)
HDL: 42.8 mg/dL (ref >39.00)
LDL Cholesterol: 78 mg/dL (ref 0–99)
NonHDL: 107.62
Total CHOL/HDL Ratio: 4
Triglycerides: 147 mg/dL (ref 0.0–149.0)
VLDL: 29.4 mg/dL (ref 0.0–40.0)

## 2024-05-20 LAB — CBC WITH DIFFERENTIAL/PLATELET
Basophils Absolute: 0.1 K/uL (ref 0.0–0.1)
Basophils Relative: 0.8 % (ref 0.0–3.0)
Eosinophils Absolute: 0.3 K/uL (ref 0.0–0.7)
Eosinophils Relative: 2.7 % (ref 0.0–5.0)
HCT: 44.6 % (ref 39.0–52.0)
Hemoglobin: 15 g/dL (ref 13.0–17.0)
Lymphocytes Relative: 31.9 % (ref 12.0–46.0)
Lymphs Abs: 3.1 K/uL (ref 0.7–4.0)
MCHC: 33.5 g/dL (ref 30.0–36.0)
MCV: 85.4 fl (ref 78.0–100.0)
Monocytes Absolute: 0.7 K/uL (ref 0.1–1.0)
Monocytes Relative: 6.8 % (ref 3.0–12.0)
Neutro Abs: 5.6 K/uL (ref 1.4–7.7)
Neutrophils Relative %: 57.8 % (ref 43.0–77.0)
Platelets: 305 K/uL (ref 150.0–400.0)
RBC: 5.23 Mil/uL (ref 4.22–5.81)
RDW: 13.6 % (ref 11.5–15.5)
WBC: 9.7 K/uL (ref 4.0–10.5)

## 2024-05-20 MED ORDER — AMLODIPINE BESYLATE 5 MG PO TABS: 5.0000 mg | ORAL_TABLET | Freq: Every day | ORAL | 1 refills | Status: AC

## 2024-05-20 MED ORDER — METHYLPREDNISOLONE ACETATE 80 MG/ML IJ SUSP
80.0000 mg | Freq: Once | INTRAMUSCULAR | Status: AC
  Administered 2024-05-20: 80 mg via INTRAMUSCULAR

## 2024-05-20 MED ORDER — SIMVASTATIN 80 MG PO TABS: 80.0000 mg | ORAL_TABLET | Freq: Every day | ORAL | 1 refills | Status: AC

## 2024-05-20 NOTE — Progress Notes (Signed)
 Subjective:    Thomas [name] is a 43 y.o. male and is here for a comprehensive physical exam.  HPI  There are no preventive care reminders to display for this patient.  Discussed the use of AI scribe software for clinical note transcription with the patient, who gave verbal consent to proceed.  History of Present Illness   Thomas [name] is a 43 year old male who presents for Comprehensive Physical Exam (CPE) preventive care annual visit. He is here with an in-person interpreter.  He is following up on elevated alkaline phosphatase levels. His endocrinologist has ruled out liver, bile ducts, gallbladder, and bone issues. A bone scan is scheduled for December 3rd.  He experiences numbness and tingling in his arm during sleep, which he attributes to his sleeping position. This has been occurring since June or July, and he is adjusting his sleeping position to alleviate symptoms.  He has hypertension and takes his amlodipine  5 mg daily. His blood pressure was 134/80 mmHg at a recent visit. He does not check this at home.  He has asthma with worsening symptoms, including wheezing and a dry cough, due to colder weather. He uses his inhaler, taking one puff in the morning and one at night, without needing his rescue inhaler.  He has lost weight since the summer, attributing this to increased physical activity, including joining a kickball team. He is focused on continuing to lose weight.  He experiences occasional headaches, which he attributes to fasting. No chest pain, leg swelling, or constipation.      Health Maintenance: Immunizations -- UpToDate ; received flu shot today Colonoscopy -- n/a PSA -- No results found for: PSA1, PSA Diet -- overall healthy Sleep habits -- no major concerns Exercise -- kickball  Weight -- Weight: 196 lb 4 oz (89 kg)  Recent weight history Wt Readings from Last 10 Encounters:  05/20/24 196 lb 4 oz (89 kg)   05/13/24 199 lb (90.3 kg)  02/19/24 198 lb (89.8 kg)  12/25/23 198 lb (89.8 kg)  09/11/23 201 lb (91.2 kg)  05/15/23 196 lb 8 oz (89.1 kg)  03/28/23 193 lb (87.5 kg)  07/23/22 196 lb 4 oz (89 kg)  06/11/22 189 lb 9.6 oz (86 kg)  04/11/22 187 lb (84.8 kg)   Body mass index is 29.84 kg/m.  Mood -- no major concerns Alcohol use --  reports no history of alcohol use.  Tobacco use --  Tobacco Use: Low Risk  (05/20/2024)   Patient History    Smoking Tobacco Use: Never    Smokeless Tobacco Use: Never    Passive Exposure: Not on file    Eligible for Low Dose CT? no  UTD with eye doctor? yes UTD with dentist? overdue     05/20/2024    1:55 PM  Depression screen PHQ 2/9  Decreased Interest 0  Down, Depressed, Hopeless 0  PHQ - 2 Score 0    Other providers/specialists: Patient Care Team: Job Lukes, [place] as PCP - General (Physician Assistant)    PMHx, SurgHx, SocialHx, Medications, and Allergies were reviewed in the Visit Navigator and updated as appropriate.   Past Medical History:  Diagnosis Date   Chronic back pain    Deaf    Hypertension     No past surgical history on file.   Family History  Problem Relation Age of Onset   Mental illness Mother    Diabetes Mother    Other Father 42  [place]   Diabetes Maternal Grandmother    Stroke Maternal Grandmother    Hypertension Maternal Grandmother    Cancer Maternal Grandfather        brain cancer   Diabetes Paternal Grandmother    Hypertension Paternal Grandmother    Stroke Paternal Grandmother    Other Paternal Grandfather        factory explosion   Hypertension Maternal Uncle    Prostate cancer Neg Hx    Colon cancer Neg Hx     Social History   Tobacco Use   Smoking status: Never   Smokeless tobacco: Never  Substance Use Topics   Alcohol use: Never   Drug use: Never    Review of Systems:   Review of Systems  Constitutional:  Negative for  chills, fever, malaise/fatigue and weight loss.  HENT:  Negative for hearing loss, sinus pain and sore throat.   Respiratory:  Negative for cough and hemoptysis.   Cardiovascular:  Negative for chest pain, palpitations, leg swelling and PND.  Gastrointestinal:  Negative for abdominal pain, constipation, diarrhea, heartburn, nausea and vomiting.  Genitourinary:  Negative for dysuria, frequency and urgency.  Musculoskeletal:  Negative for back pain, myalgias and neck pain.  Skin:  Negative for itching and rash.  Neurological:  Negative for dizziness, tingling, seizures and headaches.  Endo/Heme/Allergies:  Negative for polydipsia.  Psychiatric/Behavioral:  Negative for depression. The patient is not nervous/anxious.     Objective:    Vitals:   05/20/24 1356  BP: (!) 146/90  Pulse: 71  Temp: 98.6 F (37 C)  SpO2: 97%    Body mass index is 29.84 kg/m.  General  Alert, cooperative, no distress, appears stated age  Head:  Normocephalic, without obvious abnormality, atraumatic  Eyes:  PERRL, conjunctiva/corneas clear, EOM's intact, fundi benign, both eyes       Ears:  Normal TM's and external ear canals, both ears  Nose: Nares normal, septum midline, mucosa normal, no drainage or sinus tenderness  Throat: Lips, mucosa, and tongue normal; teeth and gums normal  Neck: Supple, symmetrical, trachea midline, no adenopathy;     thyroid:  No enlargement/tenderness/nodules; no carotid bruit or JVD  Back:   Symmetric, no curvature, ROM normal, no CVA tenderness  Lungs:   Clear to auscultation bilaterally, respirations unlabored  Chest wall:  No tenderness or deformity  Heart:  Regular rate and rhythm, S1 and S2 normal, no murmur, rub or gallop  Abdomen:   Soft, non-tender, bowel sounds active all four quadrants, no masses, no organomegaly  Extremities: Extremities normal, atraumatic, no cyanosis or edema  Prostate : Deferred  Skin: Skin color, texture, turgor normal, no rashes or lesions   Lymph nodes: Cervical, supraclavicular, and axillary nodes normal  Neurologic: CNII-XII grossly intact. Normal strength, sensation and reflexes throughout   AssessmentPlan:   Assessment and Plan    Adult Wellness Visit Routine visit with weight loss from obese to overweight. Discussed colonoscopy screening at age 79. - Continue current lifestyle modifications, including participation in [facility] team. - Encouraged regular dental and eye check-ups.  Essential hypertension Blood pressure well-controlled on amlodipine . No medication changes needed. - Continue current antihypertensive medication regimen of amlodipine  5 mg daily. - Monitor blood pressure regularly and record readings  Pure hypercholesterolemia Cholesterol levels well-controlled with statin therapy. - Continue current statin therapy of simvastatin  80 mg daily  Insulin resistance and prediabetes Weight loss improved status from obese to overweight. Previous blood sugar levels  indicated prediabetes. - Ordered blood work to check cholesterol and A1c levels.  Overweight Weight loss achieved, transitioning from obese to overweight. Continued weight management through lifestyle changes. - Encouraged continued weight management through diet and exercise.  Mild intermittent asthma Mild wheezing and dry cough likely due to weather changes. Current inhaler use effective. No need for oral steroids. - Continue current inhaler regimen.           Lucie Buttner, PA-C [facility]

## 2024-05-20 NOTE — Patient Instructions (Addendum)
 Congrats on the weight loss!!  You went from the obese category to the overweight category!  See you in 6 month(s)!

## 2024-05-21 LAB — HEMOGLOBIN A1C: Hgb A1c MFr Bld: 5.3 % (ref 4.6–6.5)

## 2024-05-22 ENCOUNTER — Ambulatory Visit: Payer: Self-pay | Admitting: Physician Assistant

## 2024-05-22 DIAGNOSIS — E876 Hypokalemia: Secondary | ICD-10-CM

## 2024-05-26 ENCOUNTER — Encounter: Payer: Self-pay | Admitting: Physician Assistant

## 2024-06-03 ENCOUNTER — Encounter (HOSPITAL_COMMUNITY)

## 2024-07-16 ENCOUNTER — Encounter: Payer: Self-pay | Admitting: Physician Assistant

## 2024-09-02 ENCOUNTER — Other Ambulatory Visit (HOSPITAL_COMMUNITY)

## 2024-11-06 ENCOUNTER — Other Ambulatory Visit

## 2024-11-11 ENCOUNTER — Ambulatory Visit: Admitting: "Endocrinology

## 2024-11-18 ENCOUNTER — Ambulatory Visit: Admitting: Physician Assistant
